# Patient Record
Sex: Female | Born: 1935 | Race: White | Hispanic: No | Smoking: Never smoker
Health system: Southern US, Community
[De-identification: ages and names within clinical notes are randomized; demographics above are authoritative.]

## PROBLEM LIST (undated history)

## (undated) DIAGNOSIS — R3129 Other microscopic hematuria: Secondary | ICD-10-CM

## (undated) DIAGNOSIS — U071 COVID-19: Secondary | ICD-10-CM

## (undated) DIAGNOSIS — A048 Other specified bacterial intestinal infections: Secondary | ICD-10-CM

## (undated) DIAGNOSIS — M810 Age-related osteoporosis without current pathological fracture: Secondary | ICD-10-CM

## (undated) DIAGNOSIS — E782 Mixed hyperlipidemia: Secondary | ICD-10-CM

## (undated) DIAGNOSIS — C439 Malignant melanoma of skin, unspecified: Secondary | ICD-10-CM

## (undated) DIAGNOSIS — I499 Cardiac arrhythmia, unspecified: Secondary | ICD-10-CM

## (undated) DIAGNOSIS — I10 Essential (primary) hypertension: Secondary | ICD-10-CM

## (undated) HISTORY — DX: Other specified bacterial intestinal infections: A04.8

## (undated) HISTORY — DX: Other microscopic hematuria: R31.29

## (undated) HISTORY — DX: Age-related osteoporosis without current pathological fracture: M81.0

## (undated) HISTORY — PX: MOLE REMOVAL: SHX2046

## (undated) HISTORY — PX: TOE SURGERY: SHX1073

## (undated) HISTORY — DX: Cardiac arrhythmia, unspecified: I49.9

## (undated) HISTORY — PX: APPENDECTOMY: SHX54

## (undated) HISTORY — PX: TUBAL LIGATION: SHX77

## (undated) HISTORY — DX: COVID-19: U07.1

## (undated) HISTORY — DX: Essential (primary) hypertension: I10

## (undated) HISTORY — DX: Mixed hyperlipidemia: E78.2

## (undated) HISTORY — DX: Malignant melanoma of skin, unspecified: C43.9

---

## 2000-12-12 ENCOUNTER — Encounter: Payer: Self-pay | Admitting: Family Medicine

## 2000-12-12 ENCOUNTER — Ambulatory Visit (HOSPITAL_COMMUNITY): Admission: RE | Admit: 2000-12-12 | Discharge: 2000-12-12 | Payer: Self-pay | Admitting: Family Medicine

## 2001-02-03 ENCOUNTER — Other Ambulatory Visit: Admission: RE | Admit: 2001-02-03 | Discharge: 2001-02-03 | Payer: Self-pay | Admitting: Family Medicine

## 2002-04-16 ENCOUNTER — Other Ambulatory Visit: Admission: RE | Admit: 2002-04-16 | Discharge: 2002-04-16 | Payer: Self-pay | Admitting: Family Medicine

## 2003-04-28 ENCOUNTER — Other Ambulatory Visit: Admission: RE | Admit: 2003-04-28 | Discharge: 2003-04-28 | Payer: Self-pay | Admitting: Family Medicine

## 2004-07-31 ENCOUNTER — Ambulatory Visit (HOSPITAL_COMMUNITY): Admission: RE | Admit: 2004-07-31 | Discharge: 2004-07-31 | Payer: Self-pay | Admitting: Gastroenterology

## 2006-08-06 ENCOUNTER — Other Ambulatory Visit: Admission: RE | Admit: 2006-08-06 | Discharge: 2006-08-06 | Payer: Self-pay | Admitting: Family Medicine

## 2007-01-31 ENCOUNTER — Encounter: Admission: RE | Admit: 2007-01-31 | Discharge: 2007-01-31 | Payer: Self-pay | Admitting: Gastroenterology

## 2009-08-18 ENCOUNTER — Other Ambulatory Visit: Admission: RE | Admit: 2009-08-18 | Discharge: 2009-08-18 | Payer: Self-pay | Admitting: Family Medicine

## 2011-01-12 NOTE — Op Note (Signed)
NAME:  Kayla Bass, Kayla Bass             ACCOUNT NO.:  [id number]   MEDICAL RECORD NO.:  [id number]          PATIENT TYPE:  AMB   LOCATION:  ENDO                         FACILITY:  [facility]   PHYSICIAN:  Graylin Shiver, M.D.   DATE OF BIRTH:  03/18/1932   DATE OF PROCEDURE:  07/31/2004  DATE OF DISCHARGE:                                 OPERATIVE REPORT   PROCEDURE:  Colonoscopy.   SURGEON:   INDICATIONS FOR PROCEDURE:  Screening.   Informed consent was obtained after explanation of the risks of bleeding,  infection and perforation.   PREMEDICATION:  Fentanyl 75 mcg IV and Versed 7 mg IV.   DESCRIPTION OF PROCEDURE:  With the patient in the left lateral decubitus  position, a rectal examination was performed.  No masses were felt.  The  Olympus colonoscope was inserted into the rectum and advanced around to the  colon to the cecum.  The cecal landmarks were identified.  The cecum and  ascending colon were normal.  The transverse colon was normal.  The  descending colon, sigmoid and rectum were normal.  The patient tolerated the  procedure well without complications.   IMPRESSION:  Normal colonoscopy to the cecum.   RECOMMENDATIONS:  I would recommend a follow-up colonoscopy again in 10  years.       SFG/MEDQ  D:  07/31/2004  T:  07/31/2004  Job:  [id number]   cc:   Dellis Anes. Idell Pickles, M.D.  [address]  [place]  [place] [address]  Fax: [phone]

## 2012-09-09 ENCOUNTER — Other Ambulatory Visit (HOSPITAL_COMMUNITY)
Admission: RE | Admit: 2012-09-09 | Discharge: 2012-09-09 | Disposition: A | Discharge status: Home / Self Care | Payer: Medicare Other | Source: Ambulatory Visit | Attending: Family Medicine | Admitting: Family Medicine

## 2012-09-09 ENCOUNTER — Other Ambulatory Visit: Payer: Self-pay | Admitting: Family Medicine

## 2012-09-09 DIAGNOSIS — Z124 Encounter for screening for malignant neoplasm of cervix: Secondary | ICD-10-CM | POA: Insufficient documentation

## 2013-02-13 ENCOUNTER — Other Ambulatory Visit: Payer: Self-pay

## 2016-10-10 ENCOUNTER — Telehealth: Payer: Self-pay

## 2016-10-10 NOTE — Telephone Encounter (Signed)
SENT NOTES TO SCHEDULING 

## 2016-11-02 ENCOUNTER — Encounter: Payer: Self-pay | Admitting: *Deleted

## 2016-11-09 ENCOUNTER — Encounter: Payer: Self-pay | Admitting: Interventional Cardiology

## 2016-11-09 ENCOUNTER — Ambulatory Visit (INDEPENDENT_AMBULATORY_CARE_PROVIDER_SITE_OTHER): Payer: Medicare Other | Admitting: Interventional Cardiology

## 2016-11-09 ENCOUNTER — Encounter (INDEPENDENT_AMBULATORY_CARE_PROVIDER_SITE_OTHER): Payer: Self-pay

## 2016-11-09 VITALS — BP 158/92 | HR 65 | Ht 65.0 in | Wt 130.8 lb

## 2016-11-09 DIAGNOSIS — E782 Mixed hyperlipidemia: Secondary | ICD-10-CM | POA: Diagnosis not present

## 2016-11-09 DIAGNOSIS — I10 Essential (primary) hypertension: Secondary | ICD-10-CM | POA: Insufficient documentation

## 2016-11-09 DIAGNOSIS — I491 Atrial premature depolarization: Secondary | ICD-10-CM | POA: Diagnosis not present

## 2016-11-09 NOTE — Progress Notes (Signed)
Cardiology Office Note   Date:  11/09/2016   ID:  Kayla Bass, DOB 1932-02-04, MRN [id number]  PCP:  Gerrit Heck, MD    No chief complaint on file. irregular heart beat   Wt Readings from Last 3 Encounters:  11/09/16 130 lb 12.8 oz (59.3 kg)       History of Present Illness: Kayla Bass is a 83 y.o. female  Who has a history of skipped beats in the past.  Discovered in [place] over 30 years ago.  Se was told not to worry about.  I twas benign.  She had no sx.    She was at a routine appt with Dr. Drema Dallas.  An irregular heart beat was heard.  She is sent her for further eval.    THe patient walks/exercises daily.  She does it 20 minutes at a time.  She volunteers at [facility]    No chest pain, SHOB, palpitations, lightheadedness, syncope.  Rare DOE if she over exerts.  Rare vertigo sx.      Past Medical History:  Diagnosis Date  . early melanoma of left arm   . Essential (primary) hypertension   . Irregular heartbeat   . Microscopic hematuria   . Mixed hyperlipidemia   . Osteoporosis     Past Surgical History:  Procedure Laterality Date  . APPENDECTOMY    . MOLE REMOVAL     cancerous  . TOE SURGERY Right   . TUBAL LIGATION Bilateral      Current Outpatient Prescriptions  Medication Sig Dispense Refill  . Biotin 5 MG CAPS Take 5 mg by mouth daily.    Marland Kitchen CALCIUM PO Take by mouth daily. As directed    . Cholecalciferol (VITAMIN D-3 PO) Take 2,800 Units by mouth daily.    . hydrochlorothiazide (HYDRODIURIL) 25 MG tablet Take 25 mg by mouth daily.    Marland Kitchen lisinopril (PRINIVIL,ZESTRIL) 10 MG tablet Take 10 mg by mouth daily.    Marland Kitchen lovastatin (MEVACOR) 40 MG tablet Take 40 mg by mouth at bedtime.    . LUTEIN PO Take 1 capsule by mouth daily.      No current facility-administered medications for this visit.     Allergies:   Benicar [olmesartan]; Fosamax [alendronate]; and Other    Social History:  The patient  reports that she has  never smoked. She has never used smokeless tobacco. She reports that she drinks alcohol.   Family History:  The patient's family history includes Alzheimer's disease in her mother; CAD in her brother, brother, and brother; Healthy in her daughter, sister, sister, sister, and son; Suicidality in her father.    ROS:  Please see the history of present illness.   Otherwise, review of systems are positive for rare joint pain.   All other systems are reviewed and negative.    PHYSICAL EXAM: VS:  BP (!) 158/92 (BP Location: Left Arm)   Pulse 65   Ht 5\' 5"  (1.651 m)   Wt 130 lb 12.8 oz (59.3 kg)   SpO2 96%   BMI 21.77 kg/m  , BMI Body mass index is 21.77 kg/m. GEN: Well nourished, well developed, in no acute distress  HEENT: normal  Neck: no JVD, carotid bruits, or masses Cardiac: RRR; no murmurs, rubs, or gallops,no edema  Respiratory:  clear to auscultation bilaterally, normal work of breathing GI: soft, nontender, nondistended, + BS MS: no deformity or atrophy  Skin: warm and dry, no rash Neuro:  Strength and  sensation are intact Psych: euthymic mood, full affect   EKG:   The ekg ordered today demonstrates NSR, prolonged PR interval, No ST segment changes   Recent Labs: No results found for requested labs within last 8760 hours.   Lipid Panel No results found for: CHOL, TRIG, HDL, CHOLHDL, VLDL, LDLCALC, LDLDIRECT   Other studies Reviewed: Additional studies/ records that were reviewed today with results demonstrating: Prior ECG shows NSR with PACs.   ASSESSMENT AND PLAN:  1. PACs: Noted on ECG from [place]. None today.  She had a benign w/u several years ago at [place].  No pathologic arrythmia noted on ECG.  No symptoms of cardiac disease.  Continue to monitor.  If she develops  symptoms, could consider further testing.     2. HTN: readings increased in the MDs office.  Lower on my recheck 146/80. 3. Hyperlipidemia: COntinue lovastatin.  Managed by Dr. Drema Dallas.   Current  medicines are reviewed at length with the patient today.  The patient concerns regarding her medicines were addressed.  The following changes have been made:  No change  Labs/ tests ordered today include:  No orders of the defined types were placed in this encounter.   Recommend 150 minutes/week of aerobic exercise Low fat, low carb, high fiber diet recommended  Disposition:   FU prn   Signed, Kayla Grooms, MD  11/09/2016 11:47 AM    [facility] [place], [place], [place]  [address] Phone: [phone]; Fax: [phone]

## 2016-11-09 NOTE — Patient Instructions (Signed)
Medication Instructions:  Your physician recommends that you continue on your current medications as directed. Please refer to the Current Medication list given to you today.   Labwork: NONE  Testing/Procedures: NONE  Follow-Up: FOLLOW UP WITH DR. VARANASI AS NEEDED  Any Other Special Instructions Will Be Listed Below (If Applicable).     If you need a refill on your cardiac medications before your next appointment, please call your pharmacy.

## 2017-10-17 ENCOUNTER — Other Ambulatory Visit: Payer: Self-pay | Admitting: Family Medicine

## 2017-11-20 ENCOUNTER — Encounter: Payer: Self-pay | Admitting: Interventional Cardiology

## 2017-11-20 ENCOUNTER — Ambulatory Visit: Payer: Medicare Other | Admitting: Interventional Cardiology

## 2017-11-20 VITALS — BP 130/74 | HR 72 | Ht 65.5 in | Wt 131.0 lb

## 2017-11-20 DIAGNOSIS — I10 Essential (primary) hypertension: Secondary | ICD-10-CM | POA: Diagnosis not present

## 2017-11-20 DIAGNOSIS — I491 Atrial premature depolarization: Secondary | ICD-10-CM | POA: Diagnosis not present

## 2017-11-20 DIAGNOSIS — E782 Mixed hyperlipidemia: Secondary | ICD-10-CM

## 2017-11-20 NOTE — Progress Notes (Signed)
Cardiology Office Note   Date:  11/20/2017   ID:  Kayla Bass, DOB [birth year], MRN [id number]  PCP:  Leighton Ruff, MD    No chief complaint on file.  PACs  Wt Readings from Last 3 Encounters:  11/20/17 131 lb (59.4 kg)  11/09/16 130 lb 12.8 oz (59.3 kg)       History of Present Illness: Kayla Bass is a 84 y.o. female Who has a history of skipped beats in the past.  Discovered in [place] over 30 years ago.  Se was told not to worry about.  I twas benign.  She had no sx.    She was at a routine appt with Dr. Drema Dallas.  An irregular heart beat was heard.  She was sent her for further eval and diagnosed with PACs.    History of hyperlipidemia treated with lovastatin, managed by Dr. Drema Dallas.  History of hypertension as well with increased readings in the doctor's office at times.  Since the last visit, she had one episode of palpitations after working in the yard, but this resolved with rest.  Episode lasted about 1-2 minutes.    Since then, she denies : Chest pain. Dizziness. Leg edema. Nitroglycerin use. Orthopnea. Palpitations. Paroxysmal nocturnal dyspnea. Shortness of breath. Syncope.   She walks on  A treadmill 5-6 days/week.  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      Past Medical History:  Diagnosis Date  . early melanoma of left arm   . Essential (primary) hypertension   . Irregular heartbeat   . Microscopic hematuria   . Mixed hyperlipidemia   . Osteoporosis     Past Surgical History:  Procedure Laterality Date  . APPENDECTOMY    . MOLE REMOVAL     cancerous  . TOE SURGERY Right   . TUBAL LIGATION Bilateral      Current Outpatient Medications  Medication Sig Dispense Refill  . Biotin 5 MG CAPS Take 5 mg by mouth daily.    Marland Kitchen CALCIUM PO Take by mouth daily. As directed    . Cholecalciferol (VITAMIN D-3 PO) Take 2,800 Units by mouth daily.    . hydrochlorothiazide (HYDRODIURIL) 25 MG tablet Take 25 mg by mouth  daily.    Marland Kitchen lisinopril (PRINIVIL,ZESTRIL) 10 MG tablet Take 10 mg by mouth daily.    Marland Kitchen lovastatin (MEVACOR) 40 MG tablet Take 40 mg by mouth at bedtime.    . LUTEIN PO Take 1 capsule by mouth daily.     . Magnesium 200 MG TABS Take 250 mg by mouth 2 (two) times daily.     No current facility-administered medications for this visit.     Allergies:   Benicar [olmesartan]; Fosamax [alendronate]; and Other    Social History:  The patient  reports that she has never smoked. She has never used smokeless tobacco. She reports that she drinks alcohol.   Family History:  The patient's family history includes Alzheimer's disease in her mother; CAD in her brother, brother, and brother; Healthy in her daughter, sister, sister, sister, and son; Suicidality in her father.    ROS:  Please see the history of present illness.   Otherwise, review of systems are positive for intermittent palpitations.   All other systems are reviewed and negative.    PHYSICAL EXAM: VS:  BP 130/74   Pulse 72   Ht 5' 5.5" (1.664 m)   Wt 131 lb (59.4 kg)   SpO2 98%  BMI 21.47 kg/m  , BMI Body mass index is 21.47 kg/m. GEN: Well nourished, well developed, in no acute distress  HEENT: normal  Neck: no JVD, carotid bruits, or masses Cardiac: RRR; no murmurs, rubs, or gallops,no edema  Respiratory:  clear to auscultation bilaterally, normal work of breathing GI: soft, nontender, nondistended, + BS MS: no deformity or atrophy  Skin: warm and dry, no rash Neuro:  Strength and sensation are intact Psych: euthymic mood, full affect   EKG:   The ekg ordered today demonstrates Normal Sinus rhythm, no ST changes   Recent Labs: No results found for requested labs within last 8760 hours.   Lipid Panel No results found for: CHOL, TRIG, HDL, CHOLHDL, VLDL, LDLCALC, LDLDIRECT   Other studies Reviewed: Additional studies/ records that were reviewed today with results demonstrating: labs reviewed LDL 90 in  2019.   ASSESSMENT AND PLAN:  1. PACs: She had some on exam but remained asymptomatic. Not causing her to limit any activities. 2. HTN: The current medical regimen is effective;  continue present plan and medications. 3. Hyperlipidemia: LDL controlled. Continue lovastatin.    Current medicines are reviewed at length with the patient today.  The patient concerns regarding her medicines were addressed.  The following changes have been made:  No change  Labs/ tests ordered today include:  No orders of the defined types were placed in this encounter.   Recommend 150 minutes/week of aerobic exercise Low fat, low carb, high fiber diet recommended  Disposition:   FU as needed   Signed, Larae Grooms, MD  11/20/2017 2:35 PM    [facility] [place], [place], [place]  [address] Phone: [phone]; Fax: [phone]

## 2017-11-20 NOTE — Patient Instructions (Signed)
Medication Instructions:  Your physician recommends that you continue on your current medications as directed. Please refer to the Current Medication list given to you today.   Labwork: None ordered  Testing/Procedures: None ordered  Follow-Up: Your physician wants you to follow-up in: AS NEEDED with Dr. Irish Lack   Any Other Special Instructions Will Be Listed Below (If Applicable).     If you need a refill on your cardiac medications before your next appointment, please call your pharmacy.

## 2019-09-25 ENCOUNTER — Ambulatory Visit: Payer: Medicare Other

## 2019-10-01 ENCOUNTER — Ambulatory Visit: Payer: Medicare Other | Attending: Internal Medicine

## 2020-12-02 DIAGNOSIS — D3132 Benign neoplasm of left choroid: Secondary | ICD-10-CM | POA: Diagnosis not present

## 2020-12-02 DIAGNOSIS — H524 Presbyopia: Secondary | ICD-10-CM | POA: Diagnosis not present

## 2020-12-02 DIAGNOSIS — H40013 Open angle with borderline findings, low risk, bilateral: Secondary | ICD-10-CM | POA: Diagnosis not present

## 2020-12-02 DIAGNOSIS — H02831 Dermatochalasis of right upper eyelid: Secondary | ICD-10-CM | POA: Diagnosis not present

## 2020-12-02 DIAGNOSIS — H35013 Changes in retinal vascular appearance, bilateral: Secondary | ICD-10-CM | POA: Diagnosis not present

## 2020-12-23 DIAGNOSIS — Z0000 Encounter for general adult medical examination without abnormal findings: Secondary | ICD-10-CM | POA: Diagnosis not present

## 2020-12-23 DIAGNOSIS — M810 Age-related osteoporosis without current pathological fracture: Secondary | ICD-10-CM | POA: Diagnosis not present

## 2020-12-23 DIAGNOSIS — I10 Essential (primary) hypertension: Secondary | ICD-10-CM | POA: Diagnosis not present

## 2020-12-23 DIAGNOSIS — E782 Mixed hyperlipidemia: Secondary | ICD-10-CM | POA: Diagnosis not present

## 2021-01-10 DIAGNOSIS — L814 Other melanin hyperpigmentation: Secondary | ICD-10-CM | POA: Diagnosis not present

## 2021-01-10 DIAGNOSIS — D225 Melanocytic nevi of trunk: Secondary | ICD-10-CM | POA: Diagnosis not present

## 2021-01-10 DIAGNOSIS — D1801 Hemangioma of skin and subcutaneous tissue: Secondary | ICD-10-CM | POA: Diagnosis not present

## 2021-01-10 DIAGNOSIS — L570 Actinic keratosis: Secondary | ICD-10-CM | POA: Diagnosis not present

## 2021-01-10 DIAGNOSIS — L821 Other seborrheic keratosis: Secondary | ICD-10-CM | POA: Diagnosis not present

## 2021-01-10 DIAGNOSIS — Z85828 Personal history of other malignant neoplasm of skin: Secondary | ICD-10-CM | POA: Diagnosis not present

## 2021-03-22 DIAGNOSIS — Z1231 Encounter for screening mammogram for malignant neoplasm of breast: Secondary | ICD-10-CM | POA: Diagnosis not present

## 2021-06-06 DIAGNOSIS — R1013 Epigastric pain: Secondary | ICD-10-CM | POA: Diagnosis not present

## 2021-06-06 DIAGNOSIS — R142 Eructation: Secondary | ICD-10-CM | POA: Diagnosis not present

## 2021-06-26 DIAGNOSIS — M810 Age-related osteoporosis without current pathological fracture: Secondary | ICD-10-CM | POA: Diagnosis not present

## 2021-06-26 DIAGNOSIS — E782 Mixed hyperlipidemia: Secondary | ICD-10-CM | POA: Diagnosis not present

## 2021-06-26 DIAGNOSIS — K219 Gastro-esophageal reflux disease without esophagitis: Secondary | ICD-10-CM | POA: Diagnosis not present

## 2021-06-26 DIAGNOSIS — I10 Essential (primary) hypertension: Secondary | ICD-10-CM | POA: Diagnosis not present

## 2021-06-29 ENCOUNTER — Encounter: Payer: Self-pay | Admitting: Nurse Practitioner

## 2021-07-05 DIAGNOSIS — M8589 Other specified disorders of bone density and structure, multiple sites: Secondary | ICD-10-CM | POA: Diagnosis not present

## 2021-07-10 DIAGNOSIS — E871 Hypo-osmolality and hyponatremia: Secondary | ICD-10-CM | POA: Diagnosis not present

## 2021-07-18 ENCOUNTER — Other Ambulatory Visit (INDEPENDENT_AMBULATORY_CARE_PROVIDER_SITE_OTHER): Payer: Medicare Other

## 2021-07-18 ENCOUNTER — Encounter: Payer: Self-pay | Admitting: Nurse Practitioner

## 2021-07-18 ENCOUNTER — Ambulatory Visit (INDEPENDENT_AMBULATORY_CARE_PROVIDER_SITE_OTHER): Payer: Medicare Other | Admitting: Nurse Practitioner

## 2021-07-18 ENCOUNTER — Other Ambulatory Visit: Payer: Self-pay

## 2021-07-18 VITALS — BP 112/74 | HR 73 | Ht 65.0 in | Wt 128.6 lb

## 2021-07-18 DIAGNOSIS — R1013 Epigastric pain: Secondary | ICD-10-CM

## 2021-07-18 DIAGNOSIS — R10814 Left lower quadrant abdominal tenderness: Secondary | ICD-10-CM | POA: Diagnosis not present

## 2021-07-18 DIAGNOSIS — R1033 Periumbilical pain: Secondary | ICD-10-CM

## 2021-07-18 DIAGNOSIS — R634 Abnormal weight loss: Secondary | ICD-10-CM

## 2021-07-18 DIAGNOSIS — R142 Eructation: Secondary | ICD-10-CM

## 2021-07-18 DIAGNOSIS — E782 Mixed hyperlipidemia: Secondary | ICD-10-CM | POA: Insufficient documentation

## 2021-07-18 DIAGNOSIS — K219 Gastro-esophageal reflux disease without esophagitis: Secondary | ICD-10-CM | POA: Insufficient documentation

## 2021-07-18 DIAGNOSIS — J302 Other seasonal allergic rhinitis: Secondary | ICD-10-CM | POA: Insufficient documentation

## 2021-07-18 DIAGNOSIS — M810 Age-related osteoporosis without current pathological fracture: Secondary | ICD-10-CM | POA: Insufficient documentation

## 2021-07-18 DIAGNOSIS — Z85820 Personal history of malignant melanoma of skin: Secondary | ICD-10-CM | POA: Insufficient documentation

## 2021-07-18 LAB — BASIC METABOLIC PANEL
BUN: 13 mg/dL (ref 6–23)
CO2: 31 mEq/L (ref 19–32)
Calcium: 9.6 mg/dL (ref 8.4–10.5)
Chloride: 99 mEq/L (ref 96–112)
Creatinine, Ser: 0.8 mg/dL (ref 0.40–1.20)
GFR: 66.03 mL/min (ref >60.00)
Glucose, Bld: 96 mg/dL (ref 70–99)
Potassium: 4.6 mEq/L (ref 3.5–5.1)
Sodium: 136 mEq/L (ref 135–145)

## 2021-07-18 MED ORDER — FAMOTIDINE 20 MG PO TABS: 20.0000 mg | ORAL_TABLET | Freq: Every day | ORAL | 1 refills | Status: DC

## 2021-07-18 NOTE — Progress Notes (Signed)
07/18/2021 Kayla Bass [id number] 28-Apr-1932   CHIEF COMPLAINT: burping, upper abdominal tightness, weight loss   HISTORY OF PRESENT ILLNESS:  Kayla Bass is an 87 year old female with a past medical history of hypertension, hyperlipidemia, osteoporosis, left arm melanoma s/p excision. Past appendectomy, tubal ligation and right toe surgery. She presents her office today as referred by Dr. Leighton Ruff for further evaluation regarding burping with associated epigastric tightness which started the last weeek of Sept 2022.  She is accompanied by her nonbiological family member [place].  She develops burping with central upper abdominal tightness which occurs at nighttime.  She takes Tums with some relief.  When she awakens in the morning, she feels well without burping and without any abdominal pain. No bloat. She sometimes has throat irritation which she attributes to having postnasal drainage.  She eats a healthy breakfast and her larger meal a day is at lunchtime and she eats a smaller dinner without experience any burping or abdominal pain.  At nighttime, she sits up to read and her burping starts and worsens when she lays down.  Her upper abdominal pain sometimes awakens her from sleep at night.  No nausea or vomiting.  She was prescribed Omeprazole 20 mg daily by her PCP on 06/06/2021 which she took for 30 days without improvement.  She also went on a GERD diet which resulted in decreased oral intake and she lost about 8 pounds.  No weight loss prior to this diet change.  No fever, sweats or chills.  She denies ever having an EGD.  She underwent 3 screening colonoscopies in her lifetime which she reported were all normal, no polyps.  Her last colonoscopy was around age 17.  She stated she and her PCP decided she no longer needed any further screening colonoscopies.  No known family history of esophageal, gastric or colon cancer.  Laboratory studies 06/26/2021: Glucose 94.  BUN 11.   Creatinine 0.80.  Sodium 133.  Potassium 4.9.  Calcium 9.8.  Total bili 0.6.  Alk phos 55.  AST 19.  ALT 12.  WBC 5.8.  Hemoglobin 14.4.  Hematocrit 41.4.  Platelet 177.  Past Medical History:  Diagnosis Date   early melanoma of left arm    Essential (primary) hypertension    Irregular heartbeat    Microscopic hematuria    Mixed hyperlipidemia    Osteoporosis    Past Surgical History:  Procedure Laterality Date   APPENDECTOMY     MOLE REMOVAL     cancerous   TOE SURGERY Right    TUBAL LIGATION Bilateral    Social History: She is widowed.  She has 1 son and 1 daughter.  She is a Agricultural engineer.  Nonsmoker. She drinks 3 glasses of wine every 2 months.   Family History: Mother with history of heart disease and Alzheimer's.  Father died from suicide. Brother with heart disease.  Brother with alcohol related cirrhosis. Sister with heart disease.    Allergies  Allergen Reactions   Benicar [Olmesartan]     Stomach upset     Fosamax [Alendronate]     Stomach upset    Other     Unsure of name - antibiotic for an UTI, caused mental confusion and cloudiness     Outpatient Encounter Medications as of 07/18/2021  Medication Sig   Biotin 5 MG CAPS Take 5 mg by mouth daily.   CALCIUM PO Take by mouth daily. As directed   Cholecalciferol (VITAMIN D-3 PO)  Take 2,800 Units by mouth daily.   hydrochlorothiazide (HYDRODIURIL) 25 MG tablet Take 25 mg by mouth daily.   lisinopril (PRINIVIL,ZESTRIL) 10 MG tablet Take 10 mg by mouth daily.   lovastatin (MEVACOR) 40 MG tablet Take 40 mg by mouth at bedtime.   LUTEIN PO Take 1 capsule by mouth daily.    Magnesium 200 MG TABS Take 250 mg by mouth 2 (two) times daily.   No facility-administered encounter medications on file as of 07/18/2021.    REVIEW OF SYSTEMS:  Gen: See HPI.  CV: Denies chest pain, palpitations or edema. Resp: Denies cough, shortness of breath of hemoptysis.  GI: See HPI.  GU : Denies urinary burning, blood in urine,  increased urinary frequency or incontinence. MS: Denies joint pain, muscles aches or weakness. Derm: Denies rash, itchiness, skin lesions or unhealing ulcers. Psych: Denies depression, anxiety or memory loss. Heme: Denies bruising, bleeding. Neuro:  Denies headaches, dizziness or paresthesias. Endo:  Denies any problems with DM, thyroid or adrenal function.   PHYSICAL EXAM: BP 112/74   Pulse 73   Ht 5' 5" (1.651 m)   Wt 128 lb 9.6 oz (58.3 kg)   SpO2 97%   BMI 21.40 kg/m  Wt Readings from Last 3 Encounters:  07/18/21 128 lb 9.6 oz (58.3 kg)  11/20/17 131 lb (59.4 kg)  11/09/16 130 lb 12.8 oz (59.3 kg)    General: 87 year old female in NAD.  Head: Normocephalic and atraumatic. Eyes:  Sclerae non-icteric, conjunctive pink. Ears: Normal auditory acuity. Mouth: Dentition intact. No ulcers or lesions.  Neck: Supple, no lymphadenopathy or thyromegaly.  Lungs: Clear bilaterally to auscultation without wheezes, crackles or rhonchi. Heart: Regular rate and rhythm. No murmur, rub or gallop appreciated.  Abdomen: Soft, non distended. Mild epigastric tenderness with moderate tenderness 2 cm above the umbilicus, mild LLQ tenderness without rebound or guarding. No masses. No hepatosplenomegaly. Normoactive bowel sounds x 4 quadrants.  Rectal: Deferred.  Musculoskeletal: Symmetrical with no gross deformities. Skin: Warm and dry. No rash or lesions on visible extremities. Extremities: No edema. Neurological: Alert oriented x 4, no focal deficits.  Psychological:  Alert and cooperative. Normal mood and affect.  ASSESSMENT AND PLAN:  65) 87 year old female with burping with associated upper abdominal tightness/pain x 2 months without nausea or vomiting.  Moderate epigastric tenderness which radiated above the umbilicus on exam today. -BMP -CTAP with oral and IV contrast, BUN and creatinine level to be reviewed prior to the patient receiving IV contrast -Consider EGD if CTAP  unrevealing -Famotidine 20 mg nightly.  Gas-X 1 tab p.o. nightly. -FODMAP diet -Patient to call office if symptoms worsen  2) Remote history of melanoma     CC:  Leighton Ruff, MD

## 2021-07-18 NOTE — Progress Notes (Signed)
I agree with the assessment and plan as outlined by Ms. Kennedy-Smith. 

## 2021-07-18 NOTE — Patient Instructions (Addendum)
IMAGING: You will be contacted by [facility] (Your caller ID will indicate phone # [phone]) in the next 7 days to schedule your Abdominal CT scan. If you have not heard from them within 7 business days, please call [place] at [phone] to follow up on the status of your appointment.    LABS:  Lab work has been ordered for you today. Our lab is located in the basement. Press "B" on the elevator. The lab is located at the first door on the left as you exit the elevator.  MEDICATION: We have sent the following medication to your pharmacy for you to pick up at your convenience: Famotidine 20 MG tablet once a day.  OVER THE COUNTER MEDICATION Please purchase the following medications over the counter and take as directed:  Gas-X take 1 at night.  We have scheduled you a follow up with Dr. Lorenso Courier on 08/17/21 at 10:30 am located on the 2nd floor.  It was great seeing you today! Thank you for entrusting me with your care and choosing [facility].  Noralyn Pick, CRNP  Low-FODMAP Eating Plan FODMAP stands for fermentable oligosaccharides, disaccharides, monosaccharides, and polyols. These are sugars that are hard for some people to digest. A low-FODMAP eating plan may help some people who have irritable bowel syndrome (IBS) and certain other bowel (intestinal) diseases to manage their symptoms. This meal plan can be complicated to follow. Work with a diet and nutrition specialist (dietitian) to make a low-FODMAP eating plan that is right for you. A dietitian can help make sure that you get enough nutrition from this diet. What are tips for following this plan? Reading food labels Check labels for hidden FODMAPs such as: High-fructose syrup. Honey. Agave. Natural fruit flavors. Onion or garlic powder. Choose low-FODMAP foods that contain 3-4 grams of fiber per serving. Check food labels for serving sizes. Eat only one  serving at a time to make sure FODMAP levels stay low. Shopping Shop with a list of foods that are recommended on this diet and make a meal plan. Meal planning Follow a low-FODMAP eating plan for up to 6 weeks, or as told by your health care provider or dietitian. To follow the eating plan: Eliminate high-FODMAP foods from your diet completely. Choose only low-FODMAP foods to eat. You will do this for 2-6 weeks. Gradually reintroduce high-FODMAP foods into your diet one at a time. Most people should wait a few days before introducing the next new high-FODMAP food into their meal plan. Your dietitian can recommend how quickly you may reintroduce foods. Keep a daily record of what and how much you eat and drink. Make note of any symptoms that you have after eating. Review your daily record with a dietitian regularly to identify which foods you can eat and which foods you should avoid. General tips Drink enough fluid each day to keep your urine pale yellow. Avoid processed foods. These often have added sugar and may be high in FODMAPs. Avoid most dairy products, whole grains, and sweeteners. Work with a dietitian to make sure you get enough fiber in your diet. Avoid high FODMAP foods at meals to manage symptoms. Recommended foods Fruits Bananas, oranges, tangerines, lemons, limes, blueberries, raspberries, strawberries, grapes, cantaloupe, honeydew melon, kiwi, papaya, passion fruit, and pineapple. Limited amounts of dried cranberries, banana chips, and shredded coconut. Vegetables Eggplant, zucchini, cucumber, peppers, green beans, bean sprouts, lettuce, arugula, kale, Swiss chard, spinach, collard greens, Cheral Marker, summer  squash, potato, and tomato. Limited amounts of corn, carrot, and sweet potato. Green parts of scallions. Grains Gluten-free grains, such as rice, oats, buckwheat, quinoa, corn, polenta, and millet. Gluten-free pasta, bread, or cereal. Rice noodles. Corn tortillas. Meats and  other proteins Unseasoned beef, pork, poultry, or fish. Eggs. Berniece Salines. Tofu (firm) and tempeh. Limited amounts of nuts and seeds, such as almonds, walnuts, [place] nuts, pecans, peanuts, nut butters, pumpkin seeds, chia seeds, and sunflower seeds. Dairy Lactose-free milk, yogurt, and kefir. Lactose-free cottage cheese and ice cream. Non-dairy milks, such as almond, coconut, hemp, and rice milk. Non-dairy yogurt. Limited amounts of goat cheese, brie, mozzarella, parmesan, swiss, and other hard cheeses. Fats and oils Butter-free spreads. Vegetable oils, such as olive, canola, and sunflower oil. Seasoning and other foods Artificial sweeteners with names that do not end in "ol," such as aspartame, saccharine, and stevia. Maple syrup, white table sugar, raw sugar, brown sugar, and molasses. Mayonnaise, soy sauce, and tamari. Fresh basil, coriander, parsley, rosemary, and thyme. Beverages Water and mineral water. Sugar-sweetened soft drinks. Small amounts of orange juice or cranberry juice. Black and green tea. Most dry wines. Coffee. The items listed above may not be a complete list of foods and beverages you can eat. Contact a dietitian for more information. Foods to avoid Fruits Fresh, dried, and juiced forms of apple, pear, watermelon, peach, plum, cherries, apricots, blackberries, boysenberries, figs, nectarines, and mango. Avocado. Vegetables Chicory root, artichoke, asparagus, cabbage, snow peas, Brussels sprouts, broccoli, sugar snap peas, mushrooms, celery, and cauliflower. Onions, garlic, leeks, and the white part of scallions. Grains Wheat, including kamut, durum, and semolina. Barley and bulgur. Couscous. Wheat-based cereals. Wheat noodles, bread, crackers, and pastries. Meats and other proteins Fried or fatty meat. Sausage. Cashews and pistachios. Soybeans, baked beans, black beans, chickpeas, kidney beans, fava beans, navy beans, lentils, black-eyed peas, and split peas. Dairy Milk, yogurt,  ice cream, and soft cheese. Cream and sour cream. Milk-based sauces. Custard. Buttermilk. Soy milk. Seasoning and other foods Any sugar-free gum or candy. Foods that contain artificial sweeteners such as sorbitol, mannitol, isomalt, or xylitol. Foods that contain honey, high-fructose corn syrup, or agave. Bouillon, vegetable stock, beef stock, and chicken stock. Garlic and onion powder. Condiments made with onion, such as hummus, chutney, pickles, relish, salad dressing, and salsa. Tomato paste. Beverages Chicory-based drinks. Coffee substitutes. Chamomile tea. Fennel tea. Sweet or fortified wines such as port or sherry. Diet soft drinks made with isomalt, mannitol, maltitol, sorbitol, or xylitol. Apple, pear, and mango juice. Juices with high-fructose corn syrup. The items listed above may not be a complete list of foods and beverages you should avoid. Contact a dietitian for more information. Summary FODMAP stands for fermentable oligosaccharides, disaccharides, monosaccharides, and polyols. These are sugars that are hard for some people to digest. A low-FODMAP eating plan is a short-term diet that helps to ease symptoms of certain bowel diseases. The eating plan usually lasts up to 6 weeks. After that, high-FODMAP foods are reintroduced gradually and one at a time. This can help you find out which foods may be causing symptoms. A low-FODMAP eating plan can be complicated. It is best to work with a dietitian who has experience with this type of plan. This information is not intended to replace advice given to you by your health care provider. Make sure you discuss any questions you have with your health care provider. Document Revised: 12/31/2019 Document Reviewed: 12/31/2019 Elsevier Patient Education  [place].

## 2021-07-18 NOTE — Progress Notes (Signed)
RADIOLOGY SCHEDULING REQUEST FOR CT Abd pelvis with contrast [facility] Scheduling via secure staff message.

## 2021-07-27 NOTE — Progress Notes (Signed)
Pt.notified

## 2021-08-02 ENCOUNTER — Other Ambulatory Visit: Payer: Self-pay

## 2021-08-02 ENCOUNTER — Encounter (HOSPITAL_COMMUNITY): Payer: Self-pay

## 2021-08-02 ENCOUNTER — Ambulatory Visit (HOSPITAL_COMMUNITY)
Admission: RE | Admit: 2021-08-02 | Discharge: 2021-08-02 | Disposition: A | Discharge status: Home / Self Care | Payer: Medicare Other | Source: Ambulatory Visit | Attending: Nurse Practitioner | Admitting: Nurse Practitioner

## 2021-08-02 DIAGNOSIS — R1013 Epigastric pain: Secondary | ICD-10-CM | POA: Insufficient documentation

## 2021-08-02 DIAGNOSIS — R1033 Periumbilical pain: Secondary | ICD-10-CM | POA: Insufficient documentation

## 2021-08-02 DIAGNOSIS — R634 Abnormal weight loss: Secondary | ICD-10-CM | POA: Diagnosis not present

## 2021-08-02 DIAGNOSIS — R10814 Left lower quadrant abdominal tenderness: Secondary | ICD-10-CM | POA: Diagnosis not present

## 2021-08-02 DIAGNOSIS — R1032 Left lower quadrant pain: Secondary | ICD-10-CM | POA: Diagnosis not present

## 2021-08-02 DIAGNOSIS — R140 Abdominal distension (gaseous): Secondary | ICD-10-CM | POA: Diagnosis not present

## 2021-08-02 MED ORDER — IOHEXOL 350 MG/ML SOLN
75.0000 mL | Freq: Once | INTRAVENOUS | Status: AC | PRN
  Administered 2021-08-02: 75 mL via INTRAVENOUS

## 2021-08-02 MED ORDER — SODIUM CHLORIDE (PF) 0.9 % IJ SOLN
INTRAMUSCULAR | Status: AC
  Filled 2021-08-02: qty 50

## 2021-08-03 ENCOUNTER — Other Ambulatory Visit: Payer: Medicare Other

## 2021-08-03 ENCOUNTER — Other Ambulatory Visit: Payer: Self-pay

## 2021-08-03 DIAGNOSIS — R634 Abnormal weight loss: Secondary | ICD-10-CM

## 2021-08-03 DIAGNOSIS — R1013 Epigastric pain: Secondary | ICD-10-CM

## 2021-08-03 DIAGNOSIS — K219 Gastro-esophageal reflux disease without esophagitis: Secondary | ICD-10-CM

## 2021-08-03 MED ORDER — PANTOPRAZOLE SODIUM 40 MG PO TBEC: 40.0000 mg | DELAYED_RELEASE_TABLET | Freq: Two times a day (BID) | ORAL | 2 refills | Status: DC

## 2021-08-08 ENCOUNTER — Other Ambulatory Visit: Payer: Medicare Other

## 2021-08-08 DIAGNOSIS — R634 Abnormal weight loss: Secondary | ICD-10-CM | POA: Diagnosis not present

## 2021-08-08 DIAGNOSIS — R1013 Epigastric pain: Secondary | ICD-10-CM

## 2021-08-09 ENCOUNTER — Other Ambulatory Visit: Payer: Self-pay

## 2021-08-09 DIAGNOSIS — B9681 Helicobacter pylori [H. pylori] as the cause of diseases classified elsewhere: Secondary | ICD-10-CM

## 2021-08-09 LAB — HELICOBACTER PYLORI  SPECIAL ANTIGEN
MICRO NUMBER:: 12750709
RESULT:: DETECTED — AB
SPECIMEN QUALITY: ADEQUATE

## 2021-08-09 MED ORDER — TETRACYCLINE HCL 500 MG PO CAPS: 500.0000 mg | ORAL_CAPSULE | Freq: Four times a day (QID) | ORAL | 0 refills | Status: DC

## 2021-08-09 MED ORDER — BISMUTH SUBSALICYLATE 262 MG PO CHEW: 262.0000 mg | CHEWABLE_TABLET | Freq: Four times a day (QID) | ORAL | 0 refills | Status: DC

## 2021-08-09 MED ORDER — BISMUTH SUBSALICYLATE 262 MG PO CHEW
524.0000 mg | CHEWABLE_TABLET | Freq: Four times a day (QID) | ORAL | 0 refills | Status: DC
Start: 2021-08-09 — End: 2021-08-16

## 2021-08-09 MED ORDER — OMEPRAZOLE 20 MG PO CPDR: 20.0000 mg | DELAYED_RELEASE_CAPSULE | Freq: Two times a day (BID) | ORAL | 0 refills | Status: DC

## 2021-08-09 MED ORDER — METRONIDAZOLE 250 MG PO TABS: 250.0000 mg | ORAL_TABLET | Freq: Four times a day (QID) | ORAL | 0 refills | Status: DC

## 2021-08-10 ENCOUNTER — Telehealth: Payer: Self-pay | Admitting: Interventional Cardiology

## 2021-08-10 NOTE — Telephone Encounter (Signed)
I spoke with patient and told her this was on her current med list but I asked her to follow up with GI for any questions.  She thought she called GI office.  Number for [facility] given to patient.

## 2021-08-10 NOTE — Telephone Encounter (Signed)
Patient wants to know if she should be taking pantoprazole (PROTONIX) 40 MG tablet.

## 2021-08-11 ENCOUNTER — Telehealth: Payer: Self-pay | Admitting: Nurse Practitioner

## 2021-08-11 NOTE — Telephone Encounter (Signed)
Returned pt call. Reminded her about our conversation as documented on 08/09/21 (refer to path report). Advised she will need to take Tetracycline, Metronidazole, Pepto Bismol and Pantoprazole x14 days. Reminded she has a follow up appointment with Dr. Lorenso Courier on 08/17/21. Further instructions will be given at her appointment re: this treatment and and about her f/u stool study to evaluate the treatment that has been prescribed. Reminded that she will NOT need to hold her Pantoprazole until she completes the 14 day course of treatment, after which she will hold for 14 days BEFORE submitting her stool specimen. Again, reminded that all of this will be detailed at the time of her visit. Verbalized acceptance and understanding.

## 2021-08-11 NOTE — Telephone Encounter (Signed)
Patient called would like to discuss her medications does not know if she is to continue taking the Pantoprazole.

## 2021-08-13 ENCOUNTER — Inpatient Hospital Stay (HOSPITAL_COMMUNITY)
Admission: EM | Admit: 2021-08-13 | Discharge: 2021-08-16 | DRG: 640 | Disposition: A | Discharge status: Home / Self Care | Payer: Medicare Other | Attending: Internal Medicine | Admitting: Internal Medicine

## 2021-08-13 ENCOUNTER — Emergency Department (HOSPITAL_COMMUNITY)
Admission: EM | Admit: 2021-08-13 | Discharge: 2021-08-13 | Discharge status: Against Medical Advice | Payer: Medicare Other | Source: Home / Self Care

## 2021-08-13 ENCOUNTER — Inpatient Hospital Stay (HOSPITAL_COMMUNITY): Payer: Medicare Other

## 2021-08-13 ENCOUNTER — Encounter (HOSPITAL_COMMUNITY): Payer: Self-pay

## 2021-08-13 ENCOUNTER — Other Ambulatory Visit (HOSPITAL_COMMUNITY): Payer: Medicare Other

## 2021-08-13 DIAGNOSIS — E871 Hypo-osmolality and hyponatremia: Secondary | ICD-10-CM | POA: Diagnosis present

## 2021-08-13 DIAGNOSIS — Z888 Allergy status to other drugs, medicaments and biological substances status: Secondary | ICD-10-CM

## 2021-08-13 DIAGNOSIS — R0602 Shortness of breath: Secondary | ICD-10-CM | POA: Diagnosis not present

## 2021-08-13 DIAGNOSIS — Z66 Do not resuscitate: Secondary | ICD-10-CM | POA: Diagnosis not present

## 2021-08-13 DIAGNOSIS — R109 Unspecified abdominal pain: Secondary | ICD-10-CM | POA: Insufficient documentation

## 2021-08-13 DIAGNOSIS — E782 Mixed hyperlipidemia: Secondary | ICD-10-CM | POA: Diagnosis present

## 2021-08-13 DIAGNOSIS — K219 Gastro-esophageal reflux disease without esophagitis: Secondary | ICD-10-CM | POA: Diagnosis not present

## 2021-08-13 DIAGNOSIS — B9681 Helicobacter pylori [H. pylori] as the cause of diseases classified elsewhere: Secondary | ICD-10-CM | POA: Diagnosis present

## 2021-08-13 DIAGNOSIS — I10 Essential (primary) hypertension: Secondary | ICD-10-CM | POA: Diagnosis present

## 2021-08-13 DIAGNOSIS — E860 Dehydration: Secondary | ICD-10-CM | POA: Diagnosis not present

## 2021-08-13 DIAGNOSIS — R197 Diarrhea, unspecified: Secondary | ICD-10-CM | POA: Diagnosis present

## 2021-08-13 DIAGNOSIS — Z85820 Personal history of malignant melanoma of skin: Secondary | ICD-10-CM

## 2021-08-13 DIAGNOSIS — I4891 Unspecified atrial fibrillation: Secondary | ICD-10-CM | POA: Diagnosis present

## 2021-08-13 DIAGNOSIS — Z8249 Family history of ischemic heart disease and other diseases of the circulatory system: Secondary | ICD-10-CM

## 2021-08-13 DIAGNOSIS — I480 Paroxysmal atrial fibrillation: Secondary | ICD-10-CM | POA: Diagnosis present

## 2021-08-13 DIAGNOSIS — U071 COVID-19: Secondary | ICD-10-CM | POA: Diagnosis present

## 2021-08-13 DIAGNOSIS — A048 Other specified bacterial intestinal infections: Secondary | ICD-10-CM | POA: Diagnosis present

## 2021-08-13 DIAGNOSIS — Z79899 Other long term (current) drug therapy: Secondary | ICD-10-CM

## 2021-08-13 DIAGNOSIS — R1110 Vomiting, unspecified: Secondary | ICD-10-CM | POA: Insufficient documentation

## 2021-08-13 DIAGNOSIS — Z820 Family history of epilepsy and other diseases of the nervous system: Secondary | ICD-10-CM | POA: Diagnosis not present

## 2021-08-13 DIAGNOSIS — E876 Hypokalemia: Secondary | ICD-10-CM | POA: Diagnosis not present

## 2021-08-13 DIAGNOSIS — I517 Cardiomegaly: Secondary | ICD-10-CM | POA: Diagnosis not present

## 2021-08-13 DIAGNOSIS — Z5321 Procedure and treatment not carried out due to patient leaving prior to being seen by health care provider: Secondary | ICD-10-CM | POA: Insufficient documentation

## 2021-08-13 LAB — URINALYSIS, ROUTINE W REFLEX MICROSCOPIC
Bilirubin Urine: NEGATIVE
Glucose, UA: NEGATIVE mg/dL
Ketones, ur: >80 mg/dL — AB
Leukocytes,Ua: NEGATIVE
Nitrite: NEGATIVE
Protein, ur: 30 mg/dL — AB
Specific Gravity, Urine: 1.02 (ref 1.005–1.030)
pH: 6.5 (ref 5.0–8.0)

## 2021-08-13 LAB — CBC
HCT: 42.8 % (ref 36.0–46.0)
HCT: 43.5 % (ref 36.0–46.0)
Hemoglobin: 14.9 g/dL (ref 12.0–15.0)
Hemoglobin: 15.4 g/dL — ABNORMAL HIGH (ref 12.0–15.0)
MCH: 30.4 pg (ref 26.0–34.0)
MCH: 30.9 pg (ref 26.0–34.0)
MCHC: 34.8 g/dL (ref 30.0–36.0)
MCHC: 35.4 g/dL (ref 30.0–36.0)
MCV: 87.2 fL (ref 80.0–100.0)
MCV: 87.3 fL (ref 80.0–100.0)
Platelets: 167 10*3/uL (ref 150–400)
Platelets: 171 10*3/uL (ref 150–400)
RBC: 4.9 MIL/uL (ref 3.87–5.11)
RBC: 4.99 MIL/uL (ref 3.87–5.11)
RDW: 11.9 % (ref 11.5–15.5)
RDW: 12 % (ref 11.5–15.5)
WBC: 3.3 10*3/uL — ABNORMAL LOW (ref 4.0–10.5)
WBC: 3.6 10*3/uL — ABNORMAL LOW (ref 4.0–10.5)
nRBC: 0 % (ref 0.0–0.2)
nRBC: 0 % (ref 0.0–0.2)

## 2021-08-13 LAB — COMPREHENSIVE METABOLIC PANEL
ALT: 19 U/L (ref 0–44)
ALT: 19 U/L (ref 0–44)
AST: 35 U/L (ref 15–41)
AST: 36 U/L (ref 15–41)
Albumin: 4 g/dL (ref 3.5–5.0)
Albumin: 4.3 g/dL (ref 3.5–5.0)
Alkaline Phosphatase: 51 U/L (ref 38–126)
Alkaline Phosphatase: 55 U/L (ref 38–126)
Anion gap: 11 (ref 5–15)
Anion gap: 12 (ref 5–15)
BUN: 6 mg/dL — ABNORMAL LOW (ref 8–23)
BUN: 7 mg/dL — ABNORMAL LOW (ref 8–23)
CO2: 26 mmol/L (ref 22–32)
CO2: 27 mmol/L (ref 22–32)
Calcium: 8.8 mg/dL — ABNORMAL LOW (ref 8.9–10.3)
Calcium: 9 mg/dL (ref 8.9–10.3)
Chloride: 82 mmol/L — ABNORMAL LOW (ref 98–111)
Chloride: 83 mmol/L — ABNORMAL LOW (ref 98–111)
Creatinine, Ser: 0.78 mg/dL (ref 0.44–1.00)
Creatinine, Ser: 0.79 mg/dL (ref 0.44–1.00)
GFR, Estimated: >60 mL/min (ref >60)
GFR, Estimated: >60 mL/min (ref >60)
Glucose, Bld: 122 mg/dL — ABNORMAL HIGH (ref 70–99)
Glucose, Bld: 135 mg/dL — ABNORMAL HIGH (ref 70–99)
Potassium: 2.6 mmol/L — CL (ref 3.5–5.1)
Potassium: 2.8 mmol/L — ABNORMAL LOW (ref 3.5–5.1)
Sodium: 120 mmol/L — ABNORMAL LOW (ref 135–145)
Sodium: 121 mmol/L — ABNORMAL LOW (ref 135–145)
Total Bilirubin: 0.8 mg/dL (ref 0.3–1.2)
Total Bilirubin: 1 mg/dL (ref 0.3–1.2)
Total Protein: 7 g/dL (ref 6.5–8.1)
Total Protein: 7.3 g/dL (ref 6.5–8.1)

## 2021-08-13 LAB — PROCALCITONIN: Procalcitonin: <0.1 ng/mL

## 2021-08-13 LAB — ECHOCARDIOGRAM LIMITED
Area-P 1/2: 4.31 cm2
Height: 65 in
P 1/2 time: 708 msec
S' Lateral: 2.4 cm
Weight: 2000 oz

## 2021-08-13 LAB — FIBRINOGEN: Fibrinogen: 319 mg/dL (ref 210–475)

## 2021-08-13 LAB — RESP PANEL BY RT-PCR (FLU A&B, COVID) ARPGX2
Influenza A by PCR: NEGATIVE
Influenza B by PCR: NEGATIVE
SARS Coronavirus 2 by RT PCR: POSITIVE — AB

## 2021-08-13 LAB — LIPASE, BLOOD
Lipase: 46 U/L (ref 11–51)
Lipase: 49 U/L (ref 11–51)

## 2021-08-13 LAB — URINALYSIS, MICROSCOPIC (REFLEX)
Bacteria, UA: NONE SEEN
Squamous Epithelial / HPF: NONE SEEN (ref 0–5)

## 2021-08-13 LAB — TSH: TSH: 4.978 u[IU]/mL — ABNORMAL HIGH (ref 0.350–4.500)

## 2021-08-13 LAB — C-REACTIVE PROTEIN: CRP: <0.5 mg/dL (ref <1.0)

## 2021-08-13 LAB — MAGNESIUM: Magnesium: 1.5 mg/dL — ABNORMAL LOW (ref 1.7–2.4)

## 2021-08-13 LAB — FERRITIN: Ferritin: 137 ng/mL (ref 11–307)

## 2021-08-13 LAB — LACTATE DEHYDROGENASE: LDH: 142 U/L (ref 98–192)

## 2021-08-13 LAB — D-DIMER, QUANTITATIVE: D-Dimer, Quant: 0.61 ug/mL-FEU — ABNORMAL HIGH (ref 0.00–0.50)

## 2021-08-13 MED ORDER — SODIUM CHLORIDE 0.9 % IV SOLN
100.0000 mg | Freq: Every day | INTRAVENOUS | Status: DC
  Administered 2021-08-14 – 2021-08-16 (×3): 100 mg via INTRAVENOUS
  Filled 2021-08-13 (×2): qty 100
  Filled 2021-08-13: qty 20

## 2021-08-13 MED ORDER — PANTOPRAZOLE SODIUM 40 MG IV SOLR
40.0000 mg | INTRAVENOUS | Status: DC
  Administered 2021-08-13 – 2021-08-14 (×2): 40 mg via INTRAVENOUS
  Filled 2021-08-13 (×2): qty 40

## 2021-08-13 MED ORDER — SODIUM CHLORIDE 0.9 % IV SOLN
200.0000 mg | Freq: Once | INTRAVENOUS | Status: AC
  Administered 2021-08-13: 11:00:00 200 mg via INTRAVENOUS
  Filled 2021-08-13: qty 40

## 2021-08-13 MED ORDER — HYDRALAZINE HCL 20 MG/ML IJ SOLN: 5.0000 mg | INTRAMUSCULAR | Status: DC | PRN

## 2021-08-13 MED ORDER — ONDANSETRON HCL 4 MG/2ML IJ SOLN
4.0000 mg | Freq: Four times a day (QID) | INTRAMUSCULAR | Status: DC | PRN
  Administered 2021-08-13: 13:00:00 4 mg via INTRAVENOUS
  Filled 2021-08-13: qty 2

## 2021-08-13 MED ORDER — PREDNISONE 20 MG PO TABS: 50.0000 mg | ORAL_TABLET | Freq: Every day | ORAL | Status: DC

## 2021-08-13 MED ORDER — MAGNESIUM OXIDE -MG SUPPLEMENT 400 (240 MG) MG PO TABS
400.0000 mg | ORAL_TABLET | Freq: Every day | ORAL | Status: DC
  Administered 2021-08-13 – 2021-08-14 (×2): 400 mg via ORAL
  Filled 2021-08-13 (×2): qty 1

## 2021-08-13 MED ORDER — MAGNESIUM 400 MG PO CAPS: 400.0000 mg | ORAL_CAPSULE | Freq: Every day | ORAL | Status: DC

## 2021-08-13 MED ORDER — PRAVASTATIN SODIUM 40 MG PO TABS
40.0000 mg | ORAL_TABLET | Freq: Every day | ORAL | Status: DC
  Administered 2021-08-13 – 2021-08-15 (×3): 40 mg via ORAL
  Filled 2021-08-13 (×3): qty 1

## 2021-08-13 MED ORDER — ACETAMINOPHEN 325 MG PO TABS: 650.0000 mg | ORAL_TABLET | Freq: Four times a day (QID) | ORAL | Status: DC | PRN

## 2021-08-13 MED ORDER — SODIUM CHLORIDE 0.9% FLUSH
3.0000 mL | Freq: Two times a day (BID) | INTRAVENOUS | Status: DC
  Administered 2021-08-13 – 2021-08-16 (×6): 3 mL via INTRAVENOUS

## 2021-08-13 MED ORDER — ONDANSETRON 4 MG PO TBDP
4.0000 mg | ORAL_TABLET | Freq: Once | ORAL | Status: AC | PRN
  Administered 2021-08-13: 04:00:00 4 mg via ORAL
  Filled 2021-08-13: qty 1

## 2021-08-13 MED ORDER — POTASSIUM CHLORIDE CRYS ER 20 MEQ PO TBCR
40.0000 meq | EXTENDED_RELEASE_TABLET | Freq: Once | ORAL | Status: AC
  Administered 2021-08-13: 07:00:00 40 meq via ORAL
  Filled 2021-08-13: qty 2

## 2021-08-13 MED ORDER — MAGNESIUM SULFATE 2 GM/50ML IV SOLN
2.0000 g | Freq: Once | INTRAVENOUS | Status: AC
  Administered 2021-08-13: 08:00:00 2 g via INTRAVENOUS
  Filled 2021-08-13: qty 50

## 2021-08-13 MED ORDER — POTASSIUM CHLORIDE IN NACL 20-0.9 MEQ/L-% IV SOLN
INTRAVENOUS | Status: DC
  Filled 2021-08-13 (×3): qty 1000

## 2021-08-13 MED ORDER — METHYLPREDNISOLONE SODIUM SUCC 125 MG IJ SOLR
1.0000 mg/kg | Freq: Two times a day (BID) | INTRAMUSCULAR | Status: DC
  Administered 2021-08-13 (×2): 56.875 mg via INTRAVENOUS
  Filled 2021-08-13 (×2): qty 2

## 2021-08-13 MED ORDER — OCUVITE-LUTEIN PO CAPS
1.0000 | ORAL_CAPSULE | Freq: Every day | ORAL | Status: DC
  Filled 2021-08-13: qty 1

## 2021-08-13 MED ORDER — ONDANSETRON HCL 4 MG PO TABS: 4.0000 mg | ORAL_TABLET | Freq: Four times a day (QID) | ORAL | Status: DC | PRN

## 2021-08-13 MED ORDER — LACTATED RINGERS IV BOLUS
1000.0000 mL | Freq: Once | INTRAVENOUS | Status: AC
  Administered 2021-08-13: 07:00:00 1000 mL via INTRAVENOUS

## 2021-08-13 MED ORDER — APIXABAN 2.5 MG PO TABS
2.5000 mg | ORAL_TABLET | Freq: Two times a day (BID) | ORAL | Status: DC
  Administered 2021-08-13 – 2021-08-16 (×7): 2.5 mg via ORAL
  Filled 2021-08-13 (×7): qty 1

## 2021-08-13 MED ORDER — ASCORBIC ACID 500 MG PO TABS
500.0000 mg | ORAL_TABLET | Freq: Every day | ORAL | Status: DC
  Administered 2021-08-13 – 2021-08-16 (×4): 500 mg via ORAL
  Filled 2021-08-13 (×4): qty 1

## 2021-08-13 MED ORDER — ZINC SULFATE 220 (50 ZN) MG PO CAPS
220.0000 mg | ORAL_CAPSULE | Freq: Every day | ORAL | Status: DC
  Administered 2021-08-13 – 2021-08-16 (×4): 220 mg via ORAL
  Filled 2021-08-13 (×4): qty 1

## 2021-08-13 MED ORDER — PROSIGHT PO TABS
1.0000 | ORAL_TABLET | Freq: Every day | ORAL | Status: DC
Start: 2021-08-13 — End: 2021-08-16
  Administered 2021-08-14 – 2021-08-16 (×3): 1 via ORAL
  Filled 2021-08-13 (×4): qty 1

## 2021-08-13 MED ORDER — ALBUTEROL SULFATE HFA 108 (90 BASE) MCG/ACT IN AERS
2.0000 | INHALATION_SPRAY | Freq: Four times a day (QID) | RESPIRATORY_TRACT | Status: DC | PRN
  Filled 2021-08-13: qty 6.7

## 2021-08-13 MED ORDER — ACETAMINOPHEN 650 MG RE SUPP: 650.0000 mg | Freq: Four times a day (QID) | RECTAL | Status: DC | PRN

## 2021-08-13 MED ORDER — PRESERVISION AREDS 2+MULTI VIT PO CAPS: 1.0000 | ORAL_CAPSULE | Freq: Every day | ORAL | Status: DC

## 2021-08-13 NOTE — ED Triage Notes (Signed)
Pt states that she has been having abd pain for the past 2 days with n/v/d, seen GI several days ago for the same and placed on antibiotics

## 2021-08-13 NOTE — H&P (Signed)
History and Physical    JAIDYN USERY NUU:[id number] DOB: 09-11-1931 DOA: 08/13/2021  PCP: Kathyrn Lass, MD Consultants:  [facility] - cardiology; Lorenso Courier - GI Patient coming from:  Home - lives with son; Donald Prose: Pandora Leiter, [phone]  Chief Complaint: n/v/d  HPI: MELENA HAYES is a 87 y.o. female with medical history significant of HTN; HLD; ?afib; and recent diagnosis of H pylori presenting with abdominal pain/n/v/d.  She was having some problems and her PCP thought it was reflux.  It didn't better so she sent her to GI.  She started medication Thursday and she has had n/v/d since then.  She did not have the n/v/d prior to starting the medication.  Last stool was this AM but minimal stools.  She has not been able to tolerate PO.  She has vomited everything PO for the last 48 hours.  No fever.  No abdominal pain, just fullness.    ED Course: Seeing GI for belching, malaise - treated for H pylori 2 days ago.  Profuse n/v/d since starting it.  Na++ 121, K+ 2.8.  EKG with afib, ?new.  Will give Mag++.  Review of Systems: As per HPI; otherwise review of systems reviewed and negative.   Ambulatory Status:  Ambulates without assistance  COVID Vaccine Status:  Complete plus booster  Past Medical History:  Diagnosis Date   early melanoma of left arm    Essential (primary) hypertension    Irregular heartbeat    Microscopic hematuria    Mixed hyperlipidemia    Osteoporosis     Past Surgical History:  Procedure Laterality Date   APPENDECTOMY     MOLE REMOVAL     cancerous   TOE SURGERY Right    TUBAL LIGATION Bilateral     Social History   Socioeconomic History   Marital status: Widowed    Spouse name: Not on file   Number of children: Not on file   Years of education: Not on file   Highest education level: Not on file  Occupational History   Occupation: retired  Tobacco Use   Smoking status: Never   Smokeless tobacco: Never  Vaping Use   Vaping Use: Never used  Substance  and Sexual Activity   Alcohol use: Yes    Comment: occassionally   Drug use: Never   Sexual activity: Not on file  Other Topics Concern   Not on file  Social History Narrative   Not on file   Social Determinants of Health   Financial Resource Strain: Not on file  Food Insecurity: Not on file  Transportation Needs: Not on file  Physical Activity: Not on file  Stress: Not on file  Social Connections: Not on file  Intimate Partner Violence: Not on file    Allergies  Allergen Reactions   Alendronate     Stomach upset  Other reaction(s): stomach upset   Benicar [Olmesartan]     Stomach upset     Other     Unsure of name - antibiotic for an UTI, caused mental confusion and cloudiness    Family History  Problem Relation Age of Onset   Alzheimer's disease Mother    Suicidality Father    Healthy Sister    CAD Brother    CAD Brother    CAD Brother    Healthy Sister    Healthy Sister    Healthy Daughter    Healthy Son     Prior to Admission medications   Medication Sig Start Date End  Date Taking? Authorizing Provider  Biotin 5 MG CAPS Take 5 mg by mouth daily.    [provider]  bismuth subsalicylate (PEPTO-BISMOL) 262 MG chewable tablet Chew 2 tablets (524 mg total) by mouth 4 (four) times daily for 14 days. 08/09/21 08/23/21  Sharyn Creamer, MD  CALCIUM PO Take by mouth daily. As directed    [provider]  Cholecalciferol (VITAMIN D-3 PO) Take 2,800 Units by mouth daily.    [provider]  famotidine (PEPCID) 20 MG tablet Take 1 tablet (20 mg total) by mouth at bedtime. 07/18/21   Noralyn Pick, NP  hydrochlorothiazide (HYDRODIURIL) 25 MG tablet Take 25 mg by mouth daily. Takes 1/2 daily    [provider]  lisinopril (PRINIVIL,ZESTRIL) 10 MG tablet Take 10 mg by mouth daily.    [provider]  lovastatin (MEVACOR) 40 MG tablet Take 40 mg by mouth at bedtime.    [provider]  LUTEIN PO Take 1  capsule by mouth daily.     [provider]  Magnesium 200 MG TABS Take 250 mg by mouth 2 (two) times daily.    [provider]  metroNIDAZOLE (FLAGYL) 250 MG tablet Take 1 tablet (250 mg total) by mouth 4 (four) times daily for 14 days. 08/09/21 08/23/21  Sharyn Creamer, MD  pantoprazole (PROTONIX) 40 MG tablet Take 1 tablet (40 mg total) by mouth 2 (two) times daily before a meal. Take 30 mins. before meals 08/03/21   Sharyn Creamer, MD  tetracycline (SUMYCIN) 500 MG capsule Take 1 capsule (500 mg total) by mouth 4 (four) times daily for 14 days. 08/09/21 08/23/21  Sharyn Creamer, MD    Physical Exam: Vitals:   08/13/21 0715 08/13/21 0815 08/13/21 0845 08/13/21 0900  BP: (!) 165/86 127/65 121/80 121/70  Pulse: (!) 44 69 90 89  Resp: 14 17 14 10   Temp:      TempSrc:      SpO2: 98% 99% 98% 99%     General:  Appears calm and comfortable and is in NAD Eyes:  PERRL, EOMI, normal lids, iris ENT:  grossly normal hearing, lips & tongue, mmm; appropriate dentition Neck:  no LAD, masses or thyromegaly Cardiovascular:  Irregularly irregular, no m/r/g. No LE edema.  Respiratory:   CTA bilaterally with no wheezes/rales/rhonchi.  Normal respiratory effort. Abdomen:  soft, NT, ND Skin:  no rash or induration seen on limited exam Musculoskeletal:  grossly normal tone BUE/BLE, good ROM, no bony abnormality Psychiatric:  grossly normal mood and affect, speech fluent and appropriate, AOx3 Neurologic:  CN 2-12 grossly intact, moves all extremities in coordinated fashion    Radiological Exams on Admission: Independently reviewed - see discussion in A/P where applicable  No results found.  EKG: Independently reviewed.  Afib with rate 80; no evidence of acute ischemia   Labs on Admission: I have personally reviewed the available labs and imaging studies at the time of the admission.  Pertinent labs:   Na++ 120 K+ 2.6 Glucose 122 WBC 3.6 UA: moderate Hgb, >80 ketones, 30  protein H pylori positive on 12/13 COVID POSITIVE   Assessment/Plan Principal Problem:   Dehydration with hyponatremia Active Problems:   Essential (primary) hypertension   Mixed hyperlipidemia   New onset atrial fibrillation (HCC)   H. pylori infection   COVID-19 virus infection   Hypokalemia due to excessive gastrointestinal loss of potassium   * Dehydration with hyponatremia -Etiology appears to be hypovolemic hyponatremia; patient with  recent GI symptoms and decreased PO intake.  -This is likely related to H pylori infection, but she also tested positive for COVID-19. -With diarrhea, C diff is also a consideration; will test for this as well. -Na++ was previously normal, >80 ketones on UA -Will hydrate with NS at 100 cc/hr -Will provide clear liquids for now - although patient reports no interest in food/drink at this time  Hypokalemia due to excessive gastrointestinal loss of potassium -Repleted in ER with 40 mEq PO. -She was also given 20 mEq/L in IVF   -Will follow.   -Mag also replaced and home Mag resumed  COVID-19 virus infection -Patient with recent diagnosis of H pylori and shortly after starting treatment she developed severe acute GI symptoms -While GI symptoms could be related to medication side effect, unexpected diagnosis of COVID may also explain her symptoms. -She does not have an O2 requirement -COVID POSITIVE -Will check COVID labs and CXR -Will admit for further evaluation, close monitoring, and treatment -Monitor on telemetry x at least 24 hours -At this time, will attempt to avoid use of aerosolized medications and use HFAs instead if needed -Will check daily labs including BMP with Mag, Phos; LFTs; CBC with differential; CRP; ferritin; fibrinogen; D-dimer -Will order steroids and Remdesivir (pharmacy consult) given +COVID test and symptoms -Encourage mobilization/ambulation as much as possible  H. pylori infection -Recently treated with Flagyl,  tetracycline, Protonix, Pepto -Will hold for now given COVID, n/v/d -Patient briefly discussed with GI -She has f/u scheduled for next week and can revisit medications at that time -C diff checked per GI recommendation -Can formally consult GI if symptoms not improving  New onset atrial fibrillation ([place]) -Patient with prior h/o PACs but no known h/o afib -She is rate controlled, even though she is not taking rate-controlling medications -Will start Eliquis -Counseling/education provided -Has f/u scheduled with Dr. Irish Lack in January  Mixed hyperlipidemia -Continue Mevacor (pravastatin per formulary)  Essential (primary) hypertension -Hold Lisinopril and HCTZ -Will cover with prn IV hydralazine       Note: This patient has been tested and is POSITIVE for the novel coronavirus COVID-19. The patient has been fully vaccinated against COVID-19.   Level of care: Telemetry Medical DVT prophylaxis: Eliquis Code Status: DNR - confirmed with patient/family Family Communication: Son was present throughout evaluation Disposition Plan:  The patient is from: home  Anticipated d/c is to: home without [facility] services   Anticipated d/c date will depend on clinical response to treatment, but possibly as early as tomorrow if she has excellent response to treatment  Patient is currently: acutely ill Consults called: GI by telephone only  Admission status:  Admit - It is my clinical opinion that admission to INPATIENT is reasonable and necessary because of the expectation that this patient will require hospital care that crosses at least 2 midnights to treat this condition based on the medical complexity of the problems presented.  Given the aforementioned information, the predictability of an adverse outcome is felt to be significant.    Karmen Bongo MD [facility]   How to contact the [facility] Attending or Consulting provider [place] or covering provider during after hours [place], for  this patient?  Check the care team in [facility] and look for a) attending/consulting [facility] provider listed and b) the [facility] team listed Log into [email or web address] and use [place]'s universal password to access. If you do not have the password, please contact the hospital operator. Locate the [facility] provider you  are looking for under [facility] and page to a number that you can be directly reached. If you still have difficulty reaching the provider, please page the [facility] (Director on Call) for the Hospitalists listed on amion for assistance.   08/13/2021, 10:14 AM

## 2021-08-13 NOTE — Assessment & Plan Note (Signed)
-  Patient with recent diagnosis of H pylori and shortly after starting treatment she developed severe acute GI symptoms -While GI symptoms could be related to medication side effect, unexpected diagnosis of COVID may also explain her symptoms. -She does not have an O2 requirement -COVID POSITIVE -Will check COVID labs and CXR -Will admit for further evaluation, close monitoring, and treatment -Monitor on telemetry x at least 24 hours -At this time, will attempt to avoid use of aerosolized medications and use HFAs instead if needed -Will check daily labs including BMP with Mag, Phos; LFTs; CBC with differential; CRP; ferritin; fibrinogen; D-dimer -Will order steroids and Remdesivir (pharmacy consult) given +COVID test and symptoms -Encourage mobilization/ambulation as much as possible

## 2021-08-13 NOTE — ED Triage Notes (Signed)
Patient arrives from home with report of abdominal discomfort with N/V/D x 2 days. Patient reports being treated for an intestinal infection, states she was put on protonix, omeprazole, and flagyl. Pt reports since starting the new medication she has been unable to keep anything on her stomach.

## 2021-08-13 NOTE — Assessment & Plan Note (Signed)
-  Recently treated with Flagyl, tetracycline, Protonix, Pepto -Will hold for now given COVID, n/v/d -Patient briefly discussed with GI -She has f/u scheduled for next week and can revisit medications at that time -C diff checked per GI recommendation -Can formally consult GI if symptoms not improving

## 2021-08-13 NOTE — Assessment & Plan Note (Signed)
-  Repleted in ER with 40 mEq PO. -She was also given 20 mEq/L in IVF   -Will follow.   -Mag also replaced and home Mag resumed

## 2021-08-13 NOTE — Assessment & Plan Note (Signed)
-  Continue Mevacor (pravastatin per formulary) ?

## 2021-08-13 NOTE — Assessment & Plan Note (Signed)
-  Patient with prior h/o PACs but no known h/o afib -She is rate controlled, even though she is not taking rate-controlling medications -Will start Eliquis -Counseling/education provided -Has f/u scheduled with Dr. Irish Lack in January

## 2021-08-13 NOTE — Assessment & Plan Note (Addendum)
-  Etiology appears to be hypovolemic hyponatremia; patient with recent GI symptoms and decreased PO intake.  -This is likely related to H pylori infection, but she also tested positive for COVID-19. -With diarrhea, C diff is also a consideration; will test for this as well. -Na++ was previously normal, >80 ketones on UA -Will hydrate with NS at 100 cc/hr -Will provide clear liquids for now - although patient reports no interest in food/drink at this time

## 2021-08-13 NOTE — Progress Notes (Signed)
°  Echocardiogram 2D Echocardiogram has been performed.  Kayla Bass 08/13/2021, 3:39 PM

## 2021-08-13 NOTE — ED Notes (Signed)
Pt ambulatory to bathroom

## 2021-08-13 NOTE — Assessment & Plan Note (Signed)
-  Hold Lisinopril and HCTZ -Will cover with prn IV hydralazine

## 2021-08-13 NOTE — ED Provider Notes (Signed)
[facility] EMERGENCY DEPARTMENT Provider Note   CSN: [id number] Arrival date & time: 08/13/21  0309     History Chief Complaint  Patient presents with   Abdominal Pain    Kayla Bass is a 87 y.o. female.  The history is provided by the patient, a relative and medical records.  Abdominal Pain Kayla Bass is a 87 y.o. female who presents to the Emergency Department complaining of vomiting, diarrhea.  She presents emergency department accompanied by her son for evaluation of vomiting and diarrhea that started 48 hours ago.  She has been experiencing issues with frequent belching and malaise and was seen by GI.  She was diagnosed with H. pylori infection and started on multiple medications 2 days ago.  Shortly after starting the medication she developed profound nausea, vomiting, diarrhea.  She reports associated mild abdominal soreness.  No fevers.  She has associated generalized weakness.    Past Medical History:  Diagnosis Date   early melanoma of left arm    Essential (primary) hypertension    Irregular heartbeat    Microscopic hematuria    Mixed hyperlipidemia    Osteoporosis     Patient Active Problem List   Diagnosis Date Noted   Gastroesophageal reflux disease without esophagitis 07/18/2021   Mixed hyperlipidemia 07/18/2021   Osteoporosis 07/18/2021   Other seasonal allergic rhinitis 07/18/2021   Personal history of malignant melanoma of skin 07/18/2021   Abdominal pain, epigastric 07/18/2021   Burping 07/18/2021   PAC (premature atrial contraction) 11/09/2016   Essential (primary) hypertension 11/09/2016    Past Surgical History:  Procedure Laterality Date   APPENDECTOMY     MOLE REMOVAL     cancerous   TOE SURGERY Right    TUBAL LIGATION Bilateral      OB History   No obstetric history on file.     Family History  Problem Relation Age of Onset   Alzheimer's disease Mother    Suicidality Father    Healthy Sister     CAD Brother    CAD Brother    CAD Brother    Healthy Sister    Healthy Sister    Healthy Daughter    Healthy Son     Social History   Tobacco Use   Smoking status: Never   Smokeless tobacco: Never  Vaping Use   Vaping Use: Never used  Substance Use Topics   Alcohol use: Yes    Comment: occassionally    Home Medications Prior to Admission medications   Medication Sig Start Date End Date Taking? Authorizing Provider  Biotin 5 MG CAPS Take 5 mg by mouth daily.    [provider]  bismuth subsalicylate (PEPTO-BISMOL) 262 MG chewable tablet Chew 2 tablets (524 mg total) by mouth 4 (four) times daily for 14 days. 08/09/21 08/23/21  Sharyn Creamer, MD  CALCIUM PO Take by mouth daily. As directed    [provider]  Cholecalciferol (VITAMIN D-3 PO) Take 2,800 Units by mouth daily.    [provider]  famotidine (PEPCID) 20 MG tablet Take 1 tablet (20 mg total) by mouth at bedtime. 07/18/21   Noralyn Pick, NP  hydrochlorothiazide (HYDRODIURIL) 25 MG tablet Take 25 mg by mouth daily. Takes 1/2 daily    [provider]  lisinopril (PRINIVIL,ZESTRIL) 10 MG tablet Take 10 mg by mouth daily.    [provider]  lovastatin (MEVACOR) 40 MG tablet Take 40 mg by mouth at bedtime.  [provider]  LUTEIN PO Take 1 capsule by mouth daily.     [provider]  Magnesium 200 MG TABS Take 250 mg by mouth 2 (two) times daily.    [provider]  metroNIDAZOLE (FLAGYL) 250 MG tablet Take 1 tablet (250 mg total) by mouth 4 (four) times daily for 14 days. 08/09/21 08/23/21  Sharyn Creamer, MD  pantoprazole (PROTONIX) 40 MG tablet Take 1 tablet (40 mg total) by mouth 2 (two) times daily before a meal. Take 30 mins. before meals 08/03/21   Sharyn Creamer, MD  tetracycline (SUMYCIN) 500 MG capsule Take 1 capsule (500 mg total) by mouth 4 (four) times daily for 14 days. 08/09/21 08/23/21  Sharyn Creamer, MD    Allergies     Alendronate, Benicar [olmesartan], and Other  Review of Systems   Review of Systems  Gastrointestinal:  Positive for abdominal pain.  All other systems reviewed and are negative.  Physical Exam Updated Vital Signs BP (!) 163/84    Pulse 67    Temp (!) 97.5 F (36.4 C) (Oral)    Resp 14    SpO2 100%   Physical Exam Vitals and nursing note reviewed.  Constitutional:      Appearance: She is well-developed.  HENT:     Head: Normocephalic and atraumatic.  Cardiovascular:     Rate and Rhythm: Normal rate and regular rhythm.     Heart sounds: No murmur heard. Pulmonary:     Effort: Pulmonary effort is normal. No respiratory distress.     Breath sounds: Normal breath sounds.  Abdominal:     Palpations: Abdomen is soft.     Tenderness: There is no guarding or rebound.     Comments: Mild generalized abdominal tenderness  Musculoskeletal:        General: No tenderness.  Skin:    General: Skin is warm and dry.  Neurological:     Mental Status: She is alert and oriented to person, place, and time.  Psychiatric:        Behavior: Behavior normal.    ED Results / Procedures / Treatments   Labs (all labs ordered are listed, but only abnormal results are displayed) Labs Reviewed  COMPREHENSIVE METABOLIC PANEL - Abnormal; Notable for the following components:      Result Value   Sodium 121 (*)    Potassium 2.8 (*)    Chloride 82 (*)    Glucose, Bld 135 (*)    BUN 6 (*)    All other components within normal limits  CBC - Abnormal; Notable for the following components:   WBC 3.3 (*)    All other components within normal limits  RESP PANEL BY RT-PCR (FLU A&B, COVID) ARPGX2  LIPASE, BLOOD  URINALYSIS, ROUTINE W REFLEX MICROSCOPIC  MAGNESIUM    EKG EKG Interpretation  Date/Time:  Sunday August 13 2021 06:44:40 EST Ventricular Rate:  80 PR Interval:    QRS Duration: 89 QT Interval:  420 QTC Calculation: 423 R Axis:   22 Text Interpretation: Atrial fibrillation No  previous ECGs available Confirmed by Quintella Reichert [phone]) on 08/13/2021 6:57:11 AM  Radiology No results found.  Procedures Procedures   Medications Ordered in ED Medications  potassium chloride SA (KLOR-CON M) CR tablet 40 mEq (has no administration in time range)  ondansetron (ZOFRAN-ODT) disintegrating tablet 4 mg (4 mg Oral Given 08/13/21 0339)  lactated ringers bolus 1,000 mL (1,000 mLs Intravenous New Bag/Given 08/13/21 0646)    ED  Course  I have reviewed the triage vital signs and the nursing notes.  Pertinent labs & imaging results that were available during my care of the patient were reviewed by me and considered in my medical decision making (see chart for details).    MDM Rules/Calculators/A&P                         Patient here for evaluation of vomiting, diarrhea and generalized weakness in setting of recently starting antibiotics for H. pylori.  She is nontoxic-appearing on evaluation but does appear dehydrated.  Labs significant for hyponatremia, hypokalemia.  She was treated with IV fluid hydration.  Given severity of her electrolyte abnormalities recommend admission for ongoing hydration and observation.  Medicine consulted for admission for ongoing treatment.    Final Clinical Impression(s) / ED Diagnoses Final diagnoses:  Hyponatremia    Rx / DC Orders ED Discharge Orders     None        Quintella Reichert, MD 08/13/21 [phone]

## 2021-08-14 ENCOUNTER — Inpatient Hospital Stay (HOSPITAL_COMMUNITY): Payer: Medicare Other

## 2021-08-14 ENCOUNTER — Telehealth: Payer: Self-pay

## 2021-08-14 LAB — CBC WITH DIFFERENTIAL/PLATELET
Abs Immature Granulocytes: 0.01 10*3/uL (ref 0.00–0.07)
Basophils Absolute: 0 10*3/uL (ref 0.0–0.1)
Basophils Relative: 0 %
Eosinophils Absolute: 0 10*3/uL (ref 0.0–0.5)
Eosinophils Relative: 0 %
HCT: 43 % (ref 36.0–46.0)
Hemoglobin: 15 g/dL (ref 12.0–15.0)
Immature Granulocytes: 0 %
Lymphocytes Relative: 19 %
Lymphs Abs: 0.7 10*3/uL (ref 0.7–4.0)
MCH: 31.4 pg (ref 26.0–34.0)
MCHC: 34.9 g/dL (ref 30.0–36.0)
MCV: 90.1 fL (ref 80.0–100.0)
Monocytes Absolute: 0.1 10*3/uL (ref 0.1–1.0)
Monocytes Relative: 4 %
Neutro Abs: 2.7 10*3/uL (ref 1.7–7.7)
Neutrophils Relative %: 77 %
Platelets: 155 10*3/uL (ref 150–400)
RBC: 4.77 MIL/uL (ref 3.87–5.11)
RDW: 12.3 % (ref 11.5–15.5)
WBC: 3.5 10*3/uL — ABNORMAL LOW (ref 4.0–10.5)
nRBC: 0 % (ref 0.0–0.2)

## 2021-08-14 LAB — COMPREHENSIVE METABOLIC PANEL
ALT: 20 U/L (ref 0–44)
AST: 42 U/L — ABNORMAL HIGH (ref 15–41)
Albumin: 3.4 g/dL — ABNORMAL LOW (ref 3.5–5.0)
Alkaline Phosphatase: 44 U/L (ref 38–126)
Anion gap: 7 (ref 5–15)
BUN: 9 mg/dL (ref 8–23)
CO2: 25 mmol/L (ref 22–32)
Calcium: 8.2 mg/dL — ABNORMAL LOW (ref 8.9–10.3)
Chloride: 98 mmol/L (ref 98–111)
Creatinine, Ser: 0.83 mg/dL (ref 0.44–1.00)
GFR, Estimated: >60 mL/min (ref >60)
Glucose, Bld: 143 mg/dL — ABNORMAL HIGH (ref 70–99)
Potassium: 4.1 mmol/L (ref 3.5–5.1)
Sodium: 130 mmol/L — ABNORMAL LOW (ref 135–145)
Total Bilirubin: 0.7 mg/dL (ref 0.3–1.2)
Total Protein: 5.9 g/dL — ABNORMAL LOW (ref 6.5–8.1)

## 2021-08-14 LAB — C DIFFICILE QUICK SCREEN W PCR REFLEX
C Diff antigen: NEGATIVE
C Diff interpretation: NOT DETECTED
C Diff toxin: NEGATIVE

## 2021-08-14 LAB — BASIC METABOLIC PANEL
Anion gap: 10 (ref 5–15)
BUN: 8 mg/dL (ref 8–23)
CO2: 25 mmol/L (ref 22–32)
Calcium: 8.4 mg/dL — ABNORMAL LOW (ref 8.9–10.3)
Chloride: 98 mmol/L (ref 98–111)
Creatinine, Ser: 0.86 mg/dL (ref 0.44–1.00)
GFR, Estimated: >60 mL/min (ref >60)
Glucose, Bld: 167 mg/dL — ABNORMAL HIGH (ref 70–99)
Potassium: 3.2 mmol/L — ABNORMAL LOW (ref 3.5–5.1)
Sodium: 133 mmol/L — ABNORMAL LOW (ref 135–145)

## 2021-08-14 LAB — URINALYSIS, MICROSCOPIC (REFLEX)

## 2021-08-14 LAB — URINALYSIS, ROUTINE W REFLEX MICROSCOPIC
Bilirubin Urine: NEGATIVE
Glucose, UA: NEGATIVE mg/dL
Ketones, ur: 15 mg/dL — AB
Leukocytes,Ua: NEGATIVE
Nitrite: NEGATIVE
Protein, ur: NEGATIVE mg/dL
Specific Gravity, Urine: 1.02 (ref 1.005–1.030)
pH: 7 (ref 5.0–8.0)

## 2021-08-14 LAB — OSMOLALITY, URINE: Osmolality, Ur: 486 mOsm/kg (ref 300–900)

## 2021-08-14 LAB — D-DIMER, QUANTITATIVE: D-Dimer, Quant: 0.65 ug/mL-FEU — ABNORMAL HIGH (ref 0.00–0.50)

## 2021-08-14 LAB — FERRITIN: Ferritin: 128 ng/mL (ref 11–307)

## 2021-08-14 LAB — CREATININE, URINE, RANDOM: Creatinine, Urine: 54.81 mg/dL

## 2021-08-14 LAB — SODIUM, URINE, RANDOM: Sodium, Ur: 113 mmol/L

## 2021-08-14 LAB — MAGNESIUM: Magnesium: 2 mg/dL (ref 1.7–2.4)

## 2021-08-14 LAB — PHOSPHORUS: Phosphorus: 2.1 mg/dL — ABNORMAL LOW (ref 2.5–4.6)

## 2021-08-14 LAB — OSMOLALITY: Osmolality: 280 mOsm/kg (ref 275–295)

## 2021-08-14 LAB — URIC ACID: Uric Acid, Serum: 2.3 mg/dL — ABNORMAL LOW (ref 2.5–7.1)

## 2021-08-14 LAB — C-REACTIVE PROTEIN: CRP: <0.5 mg/dL (ref <1.0)

## 2021-08-14 MED ORDER — DEXAMETHASONE 4 MG PO TABS
6.0000 mg | ORAL_TABLET | Freq: Every day | ORAL | Status: DC
  Administered 2021-08-14: 09:00:00 6 mg via ORAL
  Filled 2021-08-14: qty 2

## 2021-08-14 MED ORDER — METHYLPREDNISOLONE SODIUM SUCC 40 MG IJ SOLR: 40.0000 mg | Freq: Two times a day (BID) | INTRAMUSCULAR | Status: DC

## 2021-08-14 MED ORDER — POTASSIUM PHOSPHATES 15 MMOLE/5ML IV SOLN
30.0000 mmol | Freq: Once | INTRAVENOUS | Status: AC
  Administered 2021-08-14: 09:00:00 30 mmol via INTRAVENOUS
  Filled 2021-08-14: qty 10

## 2021-08-14 MED ORDER — FAMOTIDINE 20 MG PO TABS
40.0000 mg | ORAL_TABLET | Freq: Every day | ORAL | Status: DC
  Administered 2021-08-14 – 2021-08-16 (×3): 40 mg via ORAL
  Filled 2021-08-14 (×3): qty 2

## 2021-08-14 MED ORDER — POTASSIUM CHLORIDE CRYS ER 20 MEQ PO TBCR
40.0000 meq | EXTENDED_RELEASE_TABLET | Freq: Once | ORAL | Status: AC
  Administered 2021-08-14: 16:00:00 40 meq via ORAL
  Filled 2021-08-14: qty 2

## 2021-08-14 MED ORDER — METOPROLOL TARTRATE 50 MG PO TABS
50.0000 mg | ORAL_TABLET | Freq: Two times a day (BID) | ORAL | Status: DC
  Administered 2021-08-14 – 2021-08-15 (×3): 50 mg via ORAL
  Filled 2021-08-14 (×3): qty 1
  Filled 2021-08-14: qty 2

## 2021-08-14 NOTE — Telephone Encounter (Signed)
-----   Message from [place], [place] sent at 08/14/2021 12:30 PM EST ----- Regarding: Needs follow up Can you please get patient follow up in clinic in 3-4 weeks with an APP or Dr. Lorenso Courier to discuss h.pylori tx- thanks-JLL

## 2021-08-14 NOTE — Telephone Encounter (Signed)
Patient has been scheduled for a hospital follow up with Dr. Lorenso Courier on Wednesday, 09/13/21 at 11:10 am. Anderson Malta notified of patient's appt. Appt information will be on discharge papers and a letter has been mailed to patient with appt information.  Left pt a detailed vm with her appt information, advised that her appt information will be available on my chart as well.

## 2021-08-14 NOTE — Telephone Encounter (Signed)
Patient has been scheduled for a follow up with Dr. Lorenso Courier on 09/13/21 at 11:10 am. Anderson Malta is aware of appt. Letter mailed to pt with appt information.  Left detailed vm for pt with her appt information. Her son returned call and is OK with appt.

## 2021-08-14 NOTE — Progress Notes (Signed)
PROGRESS NOTE                                                                                                                                                                                                             Patient Demographics:    Kayla Bass, is a 87 y.o. female, DOB - 03/22/1932, OEU:[id number]  Outpatient Primary MD for the patient is Kathyrn Lass, MD    LOS - 1  Admit date - 08/13/2021    Chief Complaint  Patient presents with   Abdominal Pain       Brief Narrative (HPI from H&P)   Kayla Bass is a 87 y.o. female with medical history significant of HTN; HLD; ?afib; she has been recently suffering from severe gastritis and was diagnosed with H. pylori antigen in her stool by her gastroenterologist few days ago.  She was subsequently placed on H. pylori treatment, within 2 days of treatment she developed severe nausea vomiting and diarrhea and came to the hospital where she had severe dehydration with electrolyte abnormalities severe hyponatremia and she was admitted, she also had incidental COVID infection.   Subjective:    Kayla Bass today has, No headache, No chest pain, No abdominal pain - No Nausea, No new weakness tingling or numbness, nausea vomiting diarrhea much improved   Assessment  & Plan :     Severe diarrhea causing severe dehydration, hyponatremia hypokalemia and hypophosphatemia - her slightly caused by H. pylori treatment medications versus COVID-19 infection.  She has been hydrated with IV fluids electrolytes replaced, her diarrhea much improved doubt she has C. difficile but results are pending.  We will request GI to evaluate as well.  2.  H. pylori antigen positive stool.  Defer management to GI  3.  Newly diagnosed paroxysmal A. fib with RVR.  Likely due to dehydration, [place] vas 2 score of greater than 3.  Placed on Eliquis and Lopressor.  Echocardiogram nonacute, TSH  stable.  Continue to monitor.  Outpatient cardiology follow-up.  4.  Dyslipidemia.  On statin continue.  5.  Incidental COVID-19 infection.  Stable CRP.  Monitor.      Condition - Fair  Family Communication  :  [name] [phone] - 08/14/21  Code Status :  DNR  Consults  : GI  PUD Prophylaxis : Pepcid   Procedures  :  TTE - 1. Left ventricular ejection fraction, by estimation, is 65 to 70%. Left ventricular ejection fraction by 3D volume is 66 %. The left ventricle has normal function. The left ventricle has no regional wall motion abnormalities. Left ventricular diastolic  function could not be evaluated.  2. Right ventricular systolic function is normal. The right ventricular size is normal. There is normal pulmonary artery systolic pressure.  3. Trivial mitral valve regurgitation.  4. The aortic valve is normal in structure. Aortic valve regurgitation is mild. No aortic stenosis is present.        Disposition Plan  :    Status is: Inpatient  Remains inpatient appropriate because: Severe dehydration and hyponatremia    DVT Prophylaxis  :    apixaban (ELIQUIS) tablet 2.5 mg Start: 08/13/21 1000 apixaban (ELIQUIS) tablet 2.5 mg      Lab Results  Component Value Date   PLT 155 08/14/2021    Diet :  Diet Order             DIET SOFT Room service appropriate? Yes; Fluid consistency: Thin  Diet effective now                    Inpatient Medications  Scheduled Meds:  apixaban  2.5 mg Oral BID   vitamin C  500 mg Oral Daily   famotidine  40 mg Oral Daily   metoprolol tartrate  50 mg Oral BID   multivitamin  1 tablet Oral Daily   pravastatin  40 mg Oral q1800   sodium chloride flush  3 mL Intravenous Q12H   zinc sulfate  220 mg Oral Daily   Continuous Infusions:  potassium PHOSPHATE IVPB (in mmol) 30 mmol (08/14/21 0919)   remdesivir 100 mg in NS 100 mL     PRN Meds:.acetaminophen **OR** acetaminophen, albuterol, hydrALAZINE, ondansetron **OR**  ondansetron (ZOFRAN) IV  Antibiotics  :    Anti-infectives (From admission, onward)    Start     Dose/Rate Route Frequency Ordered Stop   08/14/21 1000  remdesivir 100 mg in sodium chloride 0.9 % 100 mL IVPB       See Hyperspace for full Linked Orders Report.   100 mg 200 mL/hr over 30 Minutes Intravenous Daily 08/13/21 1007 08/18/21 0959   08/13/21 1015  remdesivir 200 mg in sodium chloride 0.9% 250 mL IVPB       See Hyperspace for full Linked Orders Report.   200 mg 580 mL/hr over 30 Minutes Intravenous Once 08/13/21 1007 08/13/21 1224        Time Spent in minutes  30   Kayla Bass M.D on 08/14/2021 at 10:05 AM  To page go to [email or web address]   [facility] -  Office  [phone]  See all Orders from today for further details    Objective:   Vitals:   08/14/21 0200 08/14/21 0300 08/14/21 0654 08/14/21 0656  BP: (!) 144/59 (!) 139/59 (!) 152/65 (!) 152/65  Pulse: 66 (!) 25  62  Resp: 17 16 16 16   Temp:      TempSrc:      SpO2: 97% 97% 97% 97%    Wt Readings from Last 3 Encounters:  08/13/21 56.7 kg  07/18/21 58.3 kg  11/20/17 59.4 kg    No intake or output data in the 24 hours ending 08/14/21 1005   Physical Exam  Awake Alert, No new F.N deficits, Normal affect [place].AT,PERRAL Supple Neck, No JVD,   Symmetrical Chest wall movement, Good air  movement bilaterally, CTAB RRR,No Gallops,Rubs or new Murmurs,  +ve B.Sounds, Abd Soft, No tenderness,   No Cyanosis, Clubbing or edema       Data Review:    CBC Recent Labs  Lab 08/13/21 0344 08/13/21 0345 08/14/21 0429  WBC 3.3* 3.6* 3.5*  HGB 14.9 15.4* 15.0  HCT 42.8 43.5 43.0  PLT 171 167 155  MCV 87.3 87.2 90.1  MCH 30.4 30.9 31.4  MCHC 34.8 35.4 34.9  RDW 11.9 12.0 12.3  LYMPHSABS  --   --  0.7  MONOABS  --   --  0.1  EOSABS  --   --  0.0  BASOSABS  --   --  0.0    Electrolytes Recent Labs  Lab 08/13/21 0344 08/13/21 0345 08/13/21 1230 08/14/21 0429  NA 121* 120*  --  130*  K  2.8* 2.6*  --  4.1  CL 82* 83*  --  98  CO2 27 26  --  25  GLUCOSE 135* 122*  --  143*  BUN 6* 7*  --  9  CREATININE 0.79 0.78  --  0.83  CALCIUM 9.0 8.8*  --  8.2*  AST 35 36  --  42*  ALT 19 19  --  20  ALKPHOS 55 51  --  44  BILITOT 1.0 0.8  --  0.7  ALBUMIN 4.0 4.3  --  3.4*  MG 1.5*  --   --  2.0  CRP  --   --  <0.5 <0.5  DDIMER  --   --  0.61* 0.65*  PROCALCITON  --   --  <0.10  --   TSH  --   --  4.978*  --     ------------------------------------------------------------------------------------------------------------------ No results for input(s): CHOL, HDL, LDLCALC, TRIG, CHOLHDL, LDLDIRECT in the last 72 hours.  No results found for: HGBA1C  Recent Labs    08/13/21 1230  TSH 4.978*   ------------------------------------------------------------------------------------------------------------------ ID Labs Recent Labs  Lab 08/13/21 0344 08/13/21 0345 08/13/21 1230 08/14/21 0429  WBC 3.3* 3.6*  --  3.5*  PLT 171 167  --  155  CRP  --   --  <0.5 <0.5  DDIMER  --   --  0.61* 0.65*  PROCALCITON  --   --  <0.10  --   CREATININE 0.79 0.78  --  0.83   Cardiac Enzymes No results for input(s): CKMB, TROPONINI, MYOGLOBIN in the last 168 hours.  Invalid input(s): CK   Radiology Reports DG Chest Port 1 View  Result Date: 08/14/2021 CLINICAL DATA:  Shortness of breath over the last 2 weeks. Coronavirus infection. EXAM: PORTABLE CHEST 1 VIEW COMPARISON:  None. FINDINGS: Mild cardiomegaly. Aortic atherosclerotic calcification. Numerous calcified granulomas in the lower lungs, more numerous on the right than the left. No sign infiltrate, collapse or effusion. No focal bone finding. IMPRESSION: No active cardiopulmonary disease. Mild cardiomegaly.  Aortic atherosclerosis. Numerous calcified granulomas in the lower lungs, more numerous on the right than the left Electronically Signed   By: Nelson Chimes M.D.   On: 08/14/2021 08:11   ECHOCARDIOGRAM LIMITED  Result Date:  08/13/2021    ECHOCARDIOGRAM LIMITED REPORT   Patient Name:   EMMI WERTHEIM Date of Exam: 08/13/2021 Medical Rec #:  [id number]         Height:       65.0 in Accession #:    [id number]        Weight:       125.0 lb  Date of Birth:  1931/11/29          BSA:          1.620 m Patient Age:    45 years          BP:           121/70 mmHg Patient Gender: F                 HR:           71 bpm. Exam Location:  Inpatient Procedure: Limited Echo, Cardiac Doppler and Color Doppler Indications:    I48.91* Unspeicified atrial fibrillation  History:        Patient has no prior history of Echocardiogram examinations.                 Abnormal ECG, Arrythmias:Atrial Fibrillation; Risk                 Factors:Hypertension and Dyslipidemia. Covid positive.  Sonographer:    Roseanna Rainbow RDCS Referring Phys: [id number] JENNIFER YATES  Sonographer Comments: Technically difficult study due to poor echo windows. IMPRESSIONS  1. Left ventricular ejection fraction, by estimation, is 65 to 70%. Left ventricular ejection fraction by 3D volume is 66 %. The left ventricle has normal function. The left ventricle has no regional wall motion abnormalities. Left ventricular diastolic  function could not be evaluated.  2. Right ventricular systolic function is normal. The right ventricular size is normal. There is normal pulmonary artery systolic pressure.  3. Trivial mitral valve regurgitation.  4. The aortic valve is normal in structure. Aortic valve regurgitation is mild. No aortic stenosis is present. FINDINGS  Left Ventricle: Left ventricular ejection fraction, by estimation, is 65 to 70%. Left ventricular ejection fraction by 3D volume is 66 %. The left ventricle has normal function. The left ventricle has no regional wall motion abnormalities. The left ventricular internal cavity size was normal in size. There is no left ventricular hypertrophy. Left ventricular diastolic function could not be evaluated. Right Ventricle: The right ventricular size is  normal. Right vetricular wall thickness was not well visualized. Right ventricular systolic function is normal. There is normal pulmonary artery systolic pressure. The tricuspid regurgitant velocity is 2.27 m/s, and with an assumed right atrial pressure of 3 mmHg, the estimated right ventricular systolic pressure is 40.1 mmHg. Left Atrium: Left atrial size was normal in size. Right Atrium: Right atrial size was normal in size. Pericardium: There is no evidence of pericardial effusion. Mitral Valve: Trivial mitral valve regurgitation. Tricuspid Valve: The tricuspid valve is not well visualized. Tricuspid valve regurgitation is mild. Aortic Valve: The aortic valve is normal in structure. Aortic valve regurgitation is mild. Aortic regurgitation PHT measures 708 msec. No aortic stenosis is present. Aorta: The aortic root and ascending aorta are structurally normal, with no evidence of dilitation. LEFT VENTRICLE PLAX 2D LVIDd:         4.00 cm LVIDs:         2.40 cm LV PW:         0.90 cm         3D Volume EF LV IVS:        0.80 cm         LV 3D EF:    Left LVOT diam:     2.00 cm                      ventricul LVOT Area:     3.14  cm                     ar                                             ejection                                             fraction                                             by 3D                                             volume is                                             66 %.                                 3D Volume EF:                                3D EF:        66 %                                LV EDV:       81 ml                                LV ESV:       28 ml                                LV SV:        54 ml IVC IVC diam: 1.60 cm LEFT ATRIUM         Index LA diam:    2.10 cm 1.30 cm/m  AORTIC VALVE AI PHT:      708 msec  AORTA Ao Root diam: 3.40 cm Ao Asc diam:  3.60 cm MITRAL VALVE               TRICUSPID VALVE MV Area (PHT): 4.31 cm    TR Peak grad:   20.6 mmHg MV Decel  Time: 176 msec    TR Vmax:        227.00 cm/s MV E velocity: 75.30 cm/s MV A velocity: 64.10 cm/s  SHUNTS MV E/A ratio:  1.17        Systemic Diam: 2.00 cm Mertie Moores MD Electronically signed by Mertie Moores MD Signature Date/Time: 08/13/2021/4:04:16 PM    Final

## 2021-08-14 NOTE — Evaluation (Signed)
Occupational Therapy Evaluation Patient Details Name: Kayla Bass MRN: [id number] DOB: 05-12-1932 Today's Date: 08/14/2021   History of Present Illness Pt is 87 y/o F presenting with dehydration and hyponatremia. Found to have have COVID-19 and H. pylori. PMH includes HTN; HLD; ?afib.   Clinical Impression   Pt presents with slightly decreased dynamic standing balance. Recommend supervision during functional mobility, otherwise independent with ADLs. Should be safe to return home without any further skilled services once medically cleared. Will sign off.     Recommendations for follow up therapy are one component of a multi-disciplinary discharge planning process, led by the attending physician.  Recommendations may be updated based on patient status, additional functional criteria and insurance authorization.   Follow Up Recommendations  No OT follow up    Assistance Recommended at Discharge Set up Supervision/Assistance  Functional Status Assessment  Patient has had a recent decline in their functional status and demonstrates the ability to make significant improvements in function in a reasonable and predictable amount of time.  Equipment Recommendations  None recommended by OT    Recommendations for Other Services       Precautions / Restrictions Precautions Precautions: Other (comment) Precaution Comments: monitor O2 Restrictions Weight Bearing Restrictions: No      Mobility Bed Mobility Overal bed mobility: Independent                  Transfers Overall transfer level: Needs assistance Equipment used: None Transfers: Sit to/from Stand Sit to Stand: Supervision                  Balance Overall balance assessment: Mild deficits observed, not formally tested                                         ADL either performed or assessed with clinical judgement   ADL Overall ADL's : Needs assistance/impaired Eating/Feeding:  Independent   Grooming: Independent   Upper Body Bathing: Independent   Lower Body Bathing: Independent   Upper Body Dressing : Independent   Lower Body Dressing: Independent   Toilet Transfer: Supervision/safety   Toileting- Clothing Manipulation and Hygiene: Independent       Functional mobility during ADLs: Supervision/safety       Vision Patient Visual Report: No change from baseline       Perception     Praxis      Pertinent Vitals/Pain Pain Assessment: No/denies pain     Hand Dominance Right   Extremity/Trunk Assessment Upper Extremity Assessment Upper Extremity Assessment: Overall WFL for tasks assessed   Lower Extremity Assessment Lower Extremity Assessment: Defer to PT evaluation   Cervical / Trunk Assessment Cervical / Trunk Assessment: Normal   Communication Communication Communication: HOH   Cognition Arousal/Alertness: Awake/alert Behavior During Therapy: WFL for tasks assessed/performed Overall Cognitive Status: Within Functional Limits for tasks assessed                                       General Comments       Exercises     Shoulder Instructions      Home Living Family/patient expects to be discharged to:: Private residence Living Arrangements: Children Available Help at Discharge: Family;Available 24 hours/day Type of Home: Apartment (Apartment connected to son's home) Home Access: Level entry  Home Layout: One level     Bathroom Shower/Tub: Other (comment) (Walk-in tub with door and shower)   Bathroom Toilet: Handicapped height     Home Equipment: Shower seat - built in;Grab bars - tub/shower          Prior Functioning/Environment Prior Level of Function : Independent/Modified Independent;Driving                        OT Problem List: Impaired balance (sitting and/or standing)      OT Treatment/Interventions:      OT Goals(Current goals can be found in the care plan section)  Acute Rehab OT Goals Patient Stated Goal: return home OT Goal Formulation: With patient  OT Frequency:     Barriers to D/C:            Co-evaluation              AM-PAC OT "6 Clicks" Daily Activity     Outcome Measure Help from another person eating meals?: None Help from another person taking care of personal grooming?: None Help from another person toileting, which includes using toliet, bedpan, or urinal?: A Little Help from another person bathing (including washing, rinsing, drying)?: None Help from another person to put on and taking off regular upper body clothing?: None Help from another person to put on and taking off regular lower body clothing?: None 6 Click Score: 23   End of Session Nurse Communication: Mobility status  Activity Tolerance: Patient tolerated treatment well Patient left: in bed;with call bell/phone within reach  OT Visit Diagnosis: Unsteadiness on feet (R26.81)                Time: 1209-1228 OT Time Calculation (min): 19 min Charges:  OT General Charges $OT Visit: 1 Visit OT Evaluation $OT Eval Low Complexity: 1 Low  Wildomar Carmack C, OT/L  Acute Rehab [place] 08/14/2021, 1:37 PM

## 2021-08-14 NOTE — Progress Notes (Signed)
Case reviewed. Patient recently initiated on antibiotic therapy for a positive H. pylori stool antigen. Comes into the hospital with nausea/vomiting and also found to have COVID 19. Patient should have H. pylori treatment held for at least the next 2 to 4 weeks. Would allow the patient an opportunity to try and get through her COVID-19 infection. She will be supplemented in regards to her electrolytes as needed by the medicine team. We will set up a follow-up in clinic in the coming weeks to further discuss what H. pylori treatment can be considered.  Talicia and a triple therapy may need to be considered.  If Pat Kocher is considered will need to get insurance approval. She should remain on twice daily PPI in the interim. Other issues develop, we will be happy to evaluate the patient on an inpatient setting. She has follow-up with Dr. Lorenso Courier set up for January 2023.   Justice Britain, MD [facility] Office # [phone]

## 2021-08-14 NOTE — ED Notes (Signed)
Breakfast orders placed 

## 2021-08-14 NOTE — Telephone Encounter (Signed)
-----   Message from [place], [place] sent at 08/14/2021 12:30 PM EST ----- Regarding: Needs follow up Can you please get patient follow up in clinic in 3-4 weeks with an APP or Dr. Lorenso Courier to discuss h.pylori tx- thanks-JLL

## 2021-08-15 ENCOUNTER — Other Ambulatory Visit (HOSPITAL_COMMUNITY): Payer: Self-pay

## 2021-08-15 LAB — CBC WITH DIFFERENTIAL/PLATELET
Abs Immature Granulocytes: 0.02 10*3/uL (ref 0.00–0.07)
Basophils Absolute: 0 10*3/uL (ref 0.0–0.1)
Basophils Relative: 0 %
Eosinophils Absolute: 0 10*3/uL (ref 0.0–0.5)
Eosinophils Relative: 0 %
HCT: 38.8 % (ref 36.0–46.0)
Hemoglobin: 13.6 g/dL (ref 12.0–15.0)
Immature Granulocytes: 0 %
Lymphocytes Relative: 16 %
Lymphs Abs: 1.1 10*3/uL (ref 0.7–4.0)
MCH: 31 pg (ref 26.0–34.0)
MCHC: 35.1 g/dL (ref 30.0–36.0)
MCV: 88.4 fL (ref 80.0–100.0)
Monocytes Absolute: 0.9 10*3/uL (ref 0.1–1.0)
Monocytes Relative: 13 %
Neutro Abs: 5.1 10*3/uL (ref 1.7–7.7)
Neutrophils Relative %: 71 %
Platelets: 156 10*3/uL (ref 150–400)
RBC: 4.39 MIL/uL (ref 3.87–5.11)
RDW: 12.7 % (ref 11.5–15.5)
WBC: 7.2 10*3/uL (ref 4.0–10.5)
nRBC: 0 % (ref 0.0–0.2)

## 2021-08-15 LAB — COMPREHENSIVE METABOLIC PANEL
ALT: 19 U/L (ref 0–44)
AST: 37 U/L (ref 15–41)
Albumin: 3.4 g/dL — ABNORMAL LOW (ref 3.5–5.0)
Alkaline Phosphatase: 38 U/L (ref 38–126)
Anion gap: 7 (ref 5–15)
BUN: 13 mg/dL (ref 8–23)
CO2: 21 mmol/L — ABNORMAL LOW (ref 22–32)
Calcium: 8.4 mg/dL — ABNORMAL LOW (ref 8.9–10.3)
Chloride: 102 mmol/L (ref 98–111)
Creatinine, Ser: 0.7 mg/dL (ref 0.44–1.00)
GFR, Estimated: >60 mL/min (ref >60)
Glucose, Bld: 122 mg/dL — ABNORMAL HIGH (ref 70–99)
Potassium: 4.4 mmol/L (ref 3.5–5.1)
Sodium: 130 mmol/L — ABNORMAL LOW (ref 135–145)
Total Bilirubin: 0.4 mg/dL (ref 0.3–1.2)
Total Protein: 5.7 g/dL — ABNORMAL LOW (ref 6.5–8.1)

## 2021-08-15 LAB — MAGNESIUM: Magnesium: 2 mg/dL (ref 1.7–2.4)

## 2021-08-15 LAB — D-DIMER, QUANTITATIVE: D-Dimer, Quant: 0.74 ug/mL-FEU — ABNORMAL HIGH (ref 0.00–0.50)

## 2021-08-15 LAB — C-REACTIVE PROTEIN: CRP: <0.5 mg/dL (ref <1.0)

## 2021-08-15 LAB — PHOSPHORUS: Phosphorus: 2.2 mg/dL — ABNORMAL LOW (ref 2.5–4.6)

## 2021-08-15 LAB — UREA NITROGEN, URINE: Urea Nitrogen, Ur: 495 mg/dL

## 2021-08-15 MED ORDER — SODIUM PHOSPHATES 45 MMOLE/15ML IV SOLN
30.0000 mmol | Freq: Once | INTRAVENOUS | Status: AC
  Administered 2021-08-15: 12:00:00 30 mmol via INTRAVENOUS
  Filled 2021-08-15: qty 10

## 2021-08-15 MED ORDER — AMLODIPINE BESYLATE 10 MG PO TABS
10.0000 mg | ORAL_TABLET | Freq: Every day | ORAL | Status: DC
  Administered 2021-08-15 – 2021-08-16 (×2): 10 mg via ORAL
  Filled 2021-08-15 (×2): qty 1

## 2021-08-15 MED ORDER — BISMUTH SUBSALICYLATE 262 MG/15ML PO SUSP
30.0000 mL | ORAL | Status: DC | PRN
  Administered 2021-08-15: 20:00:00 30 mL via ORAL
  Filled 2021-08-15 (×2): qty 236

## 2021-08-15 MED ORDER — LACTATED RINGERS IV SOLN: INTRAVENOUS | Status: DC

## 2021-08-15 NOTE — Discharge Instructions (Addendum)
Follow with Primary MD Kathyrn Lass, MD in 7 days   Get CBC, CMP, 2 view Chest X ray -  checked next visit within 1 week by Primary MD    Activity: As tolerated with Full fall precautions use walker/cane & assistance as needed  Disposition Home    Diet: Heart Healthy     Special Instructions: If you have smoked or chewed Tobacco  in the last 2 yrs please stop smoking, stop any regular Alcohol  and or any Recreational drug use.  On your next visit with your primary care physician please Get Medicines reviewed and adjusted.  Please request your Prim.MD to go over all Hospital Tests and Procedure/Radiological results at the follow up, please get all Hospital records sent to your Prim MD by signing hospital release before you go home.  If you experience worsening of your admission symptoms, develop shortness of breath, life threatening emergency, suicidal or homicidal thoughts you must seek medical attention immediately by calling 911 or calling your MD immediately  if symptoms less severe.  You Must read complete instructions/literature along with all the possible adverse reactions/side effects for all the Medicines you take and that have been prescribed to you. Take any new Medicines after you have completely understood and accpet all the possible adverse reactions/side effects.       Person Under Monitoring Name: Kayla Bass  Location: [address] [place] [address]   Infection Prevention Recommendations for Individuals Confirmed to have, or Being Evaluated for, 2019 Novel Coronavirus (COVID-19) Infection Who Receive Care at Home  Individuals who are confirmed to have, or are being evaluated for, COVID-19 should follow the prevention steps below until a healthcare provider or local or state health department says they can return to normal activities.  Stay home except to get medical care You should restrict activities outside your home, except for getting medical  care. Do not go to work, school, or public areas, and do not use public transportation or taxis.  Call ahead before visiting your doctor Before your medical appointment, call the healthcare provider and tell them that you have, or are being evaluated for, COVID-19 infection. This will help the healthcare providers office take steps to keep other people from getting infected. Ask your healthcare provider to call the local or state health department.  Monitor your symptoms Seek prompt medical attention if your illness is worsening (e.g., difficulty breathing). Before going to your medical appointment, call the healthcare provider and tell them that you have, or are being evaluated for, COVID-19 infection. Ask your healthcare provider to call the local or state health department.  Wear a facemask You should wear a facemask that covers your nose and mouth when you are in the same room with other people and when you visit a healthcare provider. People who live with or visit you should also wear a facemask while they are in the same room with you.  Separate yourself from other people in your home As much as possible, you should stay in a different room from other people in your home. Also, you should use a separate bathroom, if available.  Avoid sharing household items You should not share dishes, drinking glasses, cups, eating utensils, towels, bedding, or other items with other people in your home. After using these items, you should wash them thoroughly with soap and water.  Cover your coughs and sneezes Cover your mouth and nose with a tissue when you cough or sneeze, or you can cough  or sneeze into your sleeve. Throw used tissues in a lined trash can, and immediately wash your hands with soap and water for at least 20 seconds or use an alcohol-based hand rub.  Wash your [facility] your hands often and thoroughly with soap and water for at least 20 seconds. You can use an alcohol-based  hand sanitizer if soap and water are not available and if your hands are not visibly dirty. Avoid touching your eyes, nose, and mouth with unwashed hands.   Prevention Steps for Caregivers and Household Members of Individuals Confirmed to have, or Being Evaluated for, COVID-19 Infection Being Cared for in the Home  If you live with, or provide care at home for, a person confirmed to have, or being evaluated for, COVID-19 infection please follow these guidelines to prevent infection:  Follow healthcare providers instructions Make sure that you understand and can help the patient follow any healthcare provider instructions for all care.  Provide for the patients basic needs You should help the patient with basic needs in the home and provide support for getting groceries, prescriptions, and other personal needs.  Monitor the patients symptoms If they are getting sicker, call his or her medical provider and tell them that the patient has, or is being evaluated for, COVID-19 infection. This will help the healthcare providers office take steps to keep other people from getting infected. Ask the healthcare provider to call the local or state health department.  Limit the number of people who have contact with the patient If possible, have only one caregiver for the patient. Other household members should stay in another home or place of residence. If this is not possible, they should stay in another room, or be separated from the patient as much as possible. Use a separate bathroom, if available. Restrict visitors who do not have an essential need to be in the home.  Keep older adults, very young children, and other sick people away from the patient Keep older adults, very young children, and those who have compromised immune systems or chronic health conditions away from the patient. This includes people with chronic heart, lung, or kidney conditions, diabetes, and cancer.  Ensure good  ventilation Make sure that shared spaces in the home have good air flow, such as from an air conditioner or an opened window, weather permitting.  Wash your hands often Wash your hands often and thoroughly with soap and water for at least 20 seconds. You can use an alcohol based hand sanitizer if soap and water are not available and if your hands are not visibly dirty. Avoid touching your eyes, nose, and mouth with unwashed hands. Use disposable paper towels to dry your hands. If not available, use dedicated cloth towels and replace them when they become wet.  Wear a facemask and gloves Wear a disposable facemask at all times in the room and gloves when you touch or have contact with the patients blood, body fluids, and/or secretions or excretions, such as sweat, saliva, sputum, nasal mucus, vomit, urine, or feces.  Ensure the mask fits over your nose and mouth tightly, and do not touch it during use. Throw out disposable facemasks and gloves after using them. Do not reuse. Wash your hands immediately after removing your facemask and gloves. If your personal clothing becomes contaminated, carefully remove clothing and launder. Wash your hands after handling contaminated clothing. Place all used disposable facemasks, gloves, and other waste in a lined container before disposing them with other household waste.  Remove gloves and wash your hands immediately after handling these items.  Do not share dishes, glasses, or other household items with the patient Avoid sharing household items. You should not share dishes, drinking glasses, cups, eating utensils, towels, bedding, or other items with a patient who is confirmed to have, or being evaluated for, COVID-19 infection. After the person uses these items, you should wash them thoroughly with soap and water.  Wash laundry thoroughly Immediately remove and wash clothes or bedding that have blood, body fluids, and/or secretions or excretions, such as  sweat, saliva, sputum, nasal mucus, vomit, urine, or feces, on them. Wear gloves when handling laundry from the patient. Read and follow directions on labels of laundry or clothing items and detergent. In general, wash and dry with the warmest temperatures recommended on the label.  Clean all areas the individual has used often Clean all touchable surfaces, such as counters, tabletops, doorknobs, bathroom fixtures, toilets, phones, keyboards, tablets, and bedside tables, every day. Also, clean any surfaces that may have blood, body fluids, and/or secretions or excretions on them. Wear gloves when cleaning surfaces the patient has come in contact with. Use a diluted bleach solution (e.g., dilute bleach with 1 part bleach and 10 parts water) or a household disinfectant with a label that says EPA-registered for coronaviruses. To make a bleach solution at home, add 1 tablespoon of bleach to 1 quart (4 cups) of water. For a larger supply, add  cup of bleach to 1 gallon (16 cups) of water. Read labels of cleaning products and follow recommendations provided on product labels. Labels contain instructions for safe and effective use of the cleaning product including precautions you should take when applying the product, such as wearing gloves or eye protection and making sure you have good ventilation during use of the product. Remove gloves and wash hands immediately after cleaning.  Monitor yourself for signs and symptoms of illness Caregivers and household members are considered close contacts, should monitor their health, and will be asked to limit movement outside of the home to the extent possible. Follow the monitoring steps for close contacts listed on the symptom monitoring form.   ? If you have additional questions, contact your local health department or call the epidemiologist on call at [phone] (available 24/7). ? This guidance is subject to change. For the most up-to-date guidance from  [facility], please refer to their website: YouBlogs.pl   Information on my medicine - ELIQUIS (apixaban)  This medication education was reviewed with me or my healthcare representative as part of my discharge preparation.  The pharmacist that spoke with me during my hospital stay was:  Arman Bogus, [facility]  Why was Eliquis prescribed for you? Eliquis was prescribed for you to reduce the risk of a blood clot forming that can cause a stroke if you have a medical condition called atrial fibrillation (a type of irregular heartbeat).  What do You need to know about Eliquis ? Take your Eliquis TWICE DAILY - one tablet in the morning and one tablet in the evening with or without food. If you have difficulty swallowing the tablet whole please discuss with your pharmacist how to take the medication safely.  Take Eliquis exactly as prescribed by your doctor and DO NOT stop taking Eliquis without talking to the doctor who prescribed the medication.  Stopping may increase your risk of developing a stroke.  Refill your prescription before you run out.  After discharge, you should have regular check-up appointments with your  healthcare provider that is prescribing your Eliquis.  In the future your dose may need to be changed if your kidney function or weight changes by a significant amount or as you get older.  What do you do if you miss a dose? If you miss a dose, take it as soon as you remember on the same day and resume taking twice daily.  Do not take more than one dose of ELIQUIS at the same time to make up a missed dose.  Important Safety Information A possible side effect of Eliquis is bleeding. You should call your healthcare provider right away if you experience any of the following: Bleeding from an injury or your nose that does not stop. Unusual colored urine (red or dark brown) or unusual colored stools (red or  black). Unusual bruising for unknown reasons. A serious fall or if you hit your head (even if there is no bleeding).  Some medicines may interact with Eliquis and might increase your risk of bleeding or clotting while on Eliquis. To help avoid this, consult your healthcare provider or pharmacist prior to using any new prescription or non-prescription medications, including herbals, vitamins, non-steroidal anti-inflammatory drugs (NSAIDs) and supplements.  This website has more information on Eliquis (apixaban): http://www.eliquis.com/eliquis/home

## 2021-08-15 NOTE — TOC Progression Note (Addendum)
Transition of Care [facility]) - Progression Note    Patient Details  Name: Kayla Bass MRN: [id number] Date of Birth: 11/09/1931  [facility] CM/SW Contact  Zenon Mayo, RN Phone Number: 08/15/2021, 4:31 PM  Clinical Narrative:    from home with son, dehydration, hypokalemia, h-pylori positive stool, having diarrheal, covid positive, conts on ivf's .  TOC will continue to follow for dc needs.        Expected Discharge Plan and Services                                                 Social Determinants of Health (SDOH) Interventions    Readmission Risk Interventions No flowsheet data found.

## 2021-08-15 NOTE — Plan of Care (Signed)

## 2021-08-15 NOTE — Progress Notes (Signed)
PROGRESS NOTE                                                                                                                                                                                                             Patient Demographics:    Kayla Bass, is a 87 y.o. female, DOB - 1932-08-23, BWI:[id number]  Outpatient Primary MD for the patient is Kathyrn Lass, MD    LOS - 2  Admit date - 08/13/2021    Chief Complaint  Patient presents with   Abdominal Pain       Brief Narrative (HPI from H&P)   Kayla Bass is a 87 y.o. female with medical history significant of HTN; HLD; ?afib; she has been recently suffering from severe gastritis and was diagnosed with H. pylori antigen in her stool by her gastroenterologist few days ago.  She was subsequently placed on H. pylori treatment, within 2 days of treatment she developed severe nausea vomiting and diarrhea and came to the hospital where she had severe dehydration with electrolyte abnormalities severe hyponatremia and she was admitted, she also had incidental COVID infection.   Subjective:   Patient in bed, appears comfortable, denies any headache, no fever, no chest pain or pressure, no shortness of breath , no abdominal pain. No new focal weakness.    Assessment  & Plan :     Severe diarrhea causing severe dehydration, hyponatremia hypokalemia and hypophosphatemia - her symptoms are most likely caused by H. pylori treatment medications versus COVID-19 infection.  She has been hydrated with IV fluids, electrolytes replaced, symptoms much improved continue hydration with IV fluids with gradual improvement in sodium levels, advance activity if stable discharge in the next 1 to 2 days.  2.  H. pylori antigen positive stool.  DW GI, plan is to withhold H. pylori medications with 2-week outpatient follow-up with Dr. Lorenso Courier and then reconvene the plan.  3.  Newly diagnosed  paroxysmal A. fib with RVR.  Likely due to dehydration, [place] vas 2 score of greater than 3.  Placed on Eliquis and Lopressor.  Echocardiogram nonacute, TSH stable.  Continue to monitor.  Outpatient cardiology follow-up.  4.  Dyslipidemia.  On statin continue.  5.  Incidental COVID-19 infection.  Stable CRP.  Monitor.  6.  Hypertension.  Blood pressure getting high.  On Lopressor Norvasc added.  Avoiding ACE/ARB due to severe dehydration.      Condition - Fair  Family Communication  :  [name] [phone] - 08/14/21  Code Status :  DNR  Consults  : GI  PUD Prophylaxis : Pepcid   Procedures  :     TTE - 1. Left ventricular ejection fraction, by estimation, is 65 to 70%. Left ventricular ejection fraction by 3D volume is 66 %. The left ventricle has normal function. The left ventricle has no regional wall motion abnormalities. Left ventricular diastolic  function could not be evaluated.  2. Right ventricular systolic function is normal. The right ventricular size is normal. There is normal pulmonary artery systolic pressure.  3. Trivial mitral valve regurgitation.  4. The aortic valve is normal in structure. Aortic valve regurgitation is mild. No aortic stenosis is present.        Disposition Plan  :    Status is: Inpatient  Remains inpatient appropriate because: Severe dehydration and hyponatremia    DVT Prophylaxis  :    Place TED hose Start: 08/15/21 0915 apixaban (ELIQUIS) tablet 2.5 mg Start: 08/13/21 1000 apixaban (ELIQUIS) tablet 2.5 mg      Lab Results  Component Value Date   PLT 156 08/15/2021    Diet :  Diet Order             DIET SOFT Room service appropriate? Yes; Fluid consistency: Thin  Diet effective now                    Inpatient Medications  Scheduled Meds:  amLODipine  10 mg Oral Daily   apixaban  2.5 mg Oral BID   vitamin C  500 mg Oral Daily   famotidine  40 mg Oral Daily   metoprolol tartrate  50 mg Oral BID   multivitamin  1  tablet Oral Daily   pravastatin  40 mg Oral q1800   sodium chloride flush  3 mL Intravenous Q12H   zinc sulfate  220 mg Oral Daily   Continuous Infusions:  lactated ringers     remdesivir 100 mg in NS 100 mL Stopped (08/14/21 1306)   sodium phosphate  Dextrose 5% IVPB     PRN Meds:.acetaminophen **OR** acetaminophen, albuterol, hydrALAZINE, ondansetron **OR** ondansetron (ZOFRAN) IV  Antibiotics  :    Anti-infectives (From admission, onward)    Start     Dose/Rate Route Frequency Ordered Stop   08/14/21 1000  remdesivir 100 mg in sodium chloride 0.9 % 100 mL IVPB       See Hyperspace for full Linked Orders Report.   100 mg 200 mL/hr over 30 Minutes Intravenous Daily 08/13/21 1007 08/18/21 0959   08/13/21 1015  remdesivir 200 mg in sodium chloride 0.9% 250 mL IVPB       See Hyperspace for full Linked Orders Report.   200 mg 580 mL/hr over 30 Minutes Intravenous Once 08/13/21 1007 08/13/21 1224        Time Spent in minutes  30   Lala Lund M.D on 08/15/2021 at 9:15 AM  To page go to [email or web address]   [facility] -  Office  [phone]  See all Orders from today for further details    Objective:   Vitals:   08/14/21 2027 08/15/21 0008 08/15/21 0409 08/15/21 0752  BP:  137/72 (!) 161/79 (!) 149/67  Pulse: 61 (!) 55 (!) 57 64  Resp:  17 19 19   Temp:  97.8 F (36.6 C) 97.9 F (36.6 C)  98.6 F (37 C)  TempSrc:  Oral Oral Oral  SpO2:  98% 99% 98%  Weight:   55.9 kg   Height:        Wt Readings from Last 3 Encounters:  08/15/21 55.9 kg  08/13/21 56.7 kg  07/18/21 58.3 kg     Intake/Output Summary (Last 24 hours) at 08/15/2021 0915 Last data filed at 08/15/2021 0413 Gross per 24 hour  Intake 1001.65 ml  Output 700 ml  Net 301.65 ml     Physical Exam  Awake Alert, No new F.N deficits, Normal affect [place].AT,PERRAL Supple Neck, No JVD,   Symmetrical Chest wall movement, Good air movement bilaterally, CTAB RRR,No Gallops, Rubs or new Murmurs,   +ve B.Sounds, Abd Soft, No tenderness,   No Cyanosis, Clubbing or edema       Data Review:    CBC Recent Labs  Lab 08/13/21 0344 08/13/21 0345 08/14/21 0429 08/15/21 0346  WBC 3.3* 3.6* 3.5* 7.2  HGB 14.9 15.4* 15.0 13.6  HCT 42.8 43.5 43.0 38.8  PLT 171 167 155 156  MCV 87.3 87.2 90.1 88.4  MCH 30.4 30.9 31.4 31.0  MCHC 34.8 35.4 34.9 35.1  RDW 11.9 12.0 12.3 12.7  LYMPHSABS  --   --  0.7 1.1  MONOABS  --   --  0.1 0.9  EOSABS  --   --  0.0 0.0  BASOSABS  --   --  0.0 0.0    Electrolytes Recent Labs  Lab 08/13/21 0344 08/13/21 0345 08/13/21 1230 08/14/21 0429 08/14/21 1053 08/15/21 0346  NA 121* 120*  --  130* 133* 130*  K 2.8* 2.6*  --  4.1 3.2* 4.4  CL 82* 83*  --  98 98 102  CO2 27 26  --  25 25 21*  GLUCOSE 135* 122*  --  143* 167* 122*  BUN 6* 7*  --  9 8 13   CREATININE 0.79 0.78  --  0.83 0.86 0.70  CALCIUM 9.0 8.8*  --  8.2* 8.4* 8.4*  AST 35 36  --  42*  --  37  ALT 19 19  --  20  --  19  ALKPHOS 55 51  --  44  --  38  BILITOT 1.0 0.8  --  0.7  --  0.4  ALBUMIN 4.0 4.3  --  3.4*  --  3.4*  MG 1.5*  --   --  2.0  --  2.0  CRP  --   --  <0.5 <0.5  --  <0.5  DDIMER  --   --  0.61* 0.65*  --  0.74*  PROCALCITON  --   --  <0.10  --   --   --   TSH  --   --  4.978*  --   --   --     ------------------------------------------------------------------------------------------------------------------ No results for input(s): CHOL, HDL, LDLCALC, TRIG, CHOLHDL, LDLDIRECT in the last 72 hours.  No results found for: HGBA1C  Recent Labs    08/13/21 1230  TSH 4.978*   ------------------------------------------------------------------------------------------------------------------ ID Labs Recent Labs  Lab 08/13/21 0344 08/13/21 0345 08/13/21 1230 08/14/21 0429 08/14/21 1053 08/15/21 0346  WBC 3.3* 3.6*  --  3.5*  --  7.2  PLT 171 167  --  155  --  156  CRP  --   --  <0.5 <0.5  --  <0.5  DDIMER  --   --  0.61* 0.65*  --  0.74*  PROCALCITON   --   --  <  0.10  --   --   --   CREATININE 0.79 0.78  --  0.83 0.86 0.70   Cardiac Enzymes No results for input(s): CKMB, TROPONINI, MYOGLOBIN in the last 168 hours.  Invalid input(s): CK   Radiology Reports DG Chest Port 1 View  Result Date: 08/14/2021 CLINICAL DATA:  Shortness of breath over the last 2 weeks. Coronavirus infection. EXAM: PORTABLE CHEST 1 VIEW COMPARISON:  None. FINDINGS: Mild cardiomegaly. Aortic atherosclerotic calcification. Numerous calcified granulomas in the lower lungs, more numerous on the right than the left. No sign infiltrate, collapse or effusion. No focal bone finding. IMPRESSION: No active cardiopulmonary disease. Mild cardiomegaly.  Aortic atherosclerosis. Numerous calcified granulomas in the lower lungs, more numerous on the right than the left Electronically Signed   By: Nelson Chimes M.D.   On: 08/14/2021 08:11   ECHOCARDIOGRAM LIMITED  Result Date: 08/13/2021    ECHOCARDIOGRAM LIMITED REPORT   Patient Name:   SURINA STORTS Date of Exam: 08/13/2021 Medical Rec #:  [id number]         Height:       65.0 in Accession #:    [id number]        Weight:       125.0 lb Date of Birth:  07/03/1932          BSA:          1.620 m Patient Age:    4 years          BP:           121/70 mmHg Patient Gender: F                 HR:           71 bpm. Exam Location:  Inpatient Procedure: Limited Echo, Cardiac Doppler and Color Doppler Indications:    I48.91* Unspeicified atrial fibrillation  History:        Patient has no prior history of Echocardiogram examinations.                 Abnormal ECG, Arrythmias:Atrial Fibrillation; Risk                 Factors:Hypertension and Dyslipidemia. Covid positive.  Sonographer:    Roseanna Rainbow RDCS Referring Phys: [id number] JENNIFER YATES  Sonographer Comments: Technically difficult study due to poor echo windows. IMPRESSIONS  1. Left ventricular ejection fraction, by estimation, is 65 to 70%. Left ventricular ejection fraction by 3D volume is 66 %. The  left ventricle has normal function. The left ventricle has no regional wall motion abnormalities. Left ventricular diastolic  function could not be evaluated.  2. Right ventricular systolic function is normal. The right ventricular size is normal. There is normal pulmonary artery systolic pressure.  3. Trivial mitral valve regurgitation.  4. The aortic valve is normal in structure. Aortic valve regurgitation is mild. No aortic stenosis is present. FINDINGS  Left Ventricle: Left ventricular ejection fraction, by estimation, is 65 to 70%. Left ventricular ejection fraction by 3D volume is 66 %. The left ventricle has normal function. The left ventricle has no regional wall motion abnormalities. The left ventricular internal cavity size was normal in size. There is no left ventricular hypertrophy. Left ventricular diastolic function could not be evaluated. Right Ventricle: The right ventricular size is normal. Right vetricular wall thickness was not well visualized. Right ventricular systolic function is normal. There is normal pulmonary artery systolic pressure. The tricuspid regurgitant velocity is 2.27 m/s, and with  an assumed right atrial pressure of 3 mmHg, the estimated right ventricular systolic pressure is 16.1 mmHg. Left Atrium: Left atrial size was normal in size. Right Atrium: Right atrial size was normal in size. Pericardium: There is no evidence of pericardial effusion. Mitral Valve: Trivial mitral valve regurgitation. Tricuspid Valve: The tricuspid valve is not well visualized. Tricuspid valve regurgitation is mild. Aortic Valve: The aortic valve is normal in structure. Aortic valve regurgitation is mild. Aortic regurgitation PHT measures 708 msec. No aortic stenosis is present. Aorta: The aortic root and ascending aorta are structurally normal, with no evidence of dilitation. LEFT VENTRICLE PLAX 2D LVIDd:         4.00 cm LVIDs:         2.40 cm LV PW:         0.90 cm         3D Volume EF LV IVS:         0.80 cm         LV 3D EF:    Left LVOT diam:     2.00 cm                      ventricul LVOT Area:     3.14 cm                     ar                                             ejection                                             fraction                                             by 3D                                             volume is                                             66 %.                                 3D Volume EF:                                3D EF:        66 %                                LV EDV:       81 ml  LV ESV:       28 ml                                LV SV:        54 ml IVC IVC diam: 1.60 cm LEFT ATRIUM         Index LA diam:    2.10 cm 1.30 cm/m  AORTIC VALVE AI PHT:      708 msec  AORTA Ao Root diam: 3.40 cm Ao Asc diam:  3.60 cm MITRAL VALVE               TRICUSPID VALVE MV Area (PHT): 4.31 cm    TR Peak grad:   20.6 mmHg MV Decel Time: 176 msec    TR Vmax:        227.00 cm/s MV E velocity: 75.30 cm/s MV A velocity: 64.10 cm/s  SHUNTS MV E/A ratio:  1.17        Systemic Diam: 2.00 cm Mertie Moores MD Electronically signed by Mertie Moores MD Signature Date/Time: 08/13/2021/4:04:16 PM    Final

## 2021-08-15 NOTE — Progress Notes (Signed)
Mobility Specialist Progress Note:   08/15/21 1500  Mobility  Activity Ambulated to bathroom;Ambulated in room  Level of Assistance Independent  Assistive Device None  Distance Ambulated (ft) 30 ft  Mobility Out of bed for toileting;Ambulated with assistance in room  Mobility Response Tolerated well  Mobility performed by Mobility specialist  $Mobility charge 1 Mobility   No complaints of pain. Pt left EOB with call bell in reach and all needs met.   [facility] Public librarian Phone [phone] Secondary Phone [phone]

## 2021-08-15 NOTE — Evaluation (Signed)
Physical Therapy Evaluation Patient Details Name: Kayla Bass MRN: [id number] DOB: Aug 24, 1932 Today's Date: 08/15/2021  History of Present Illness  Pt is 86 y/o F presenting with dehydration and hyponatremia. Found to have have COVID-19 and H. pylori. PMH includes HTN; HLD; ?afib.   Clinical Impression  Patient received in bed, she reports she is feeling better. Was able to eat some of her breakfast. She is independent with bed mobility, transfers and ambulation in room without AD. Patient is weaker than her baseline due to not eating, but does not require continued skilled PT at this time. Patient will be able to return home at discharge without PT follow up and assistance from son as needed. PT to sign off.          Recommendations for follow up therapy are one component of a multi-disciplinary discharge planning process, led by the attending physician.  Recommendations may be updated based on patient status, additional functional criteria and insurance authorization.  Follow Up Recommendations No PT follow up    Assistance Recommended at Discharge Intermittent Supervision/Assistance  Functional Status Assessment Patient has had a recent decline in their functional status and demonstrates the ability to make significant improvements in function in a reasonable and predictable amount of time.  Equipment Recommendations  None recommended by PT    Recommendations for Other Services       Precautions / Restrictions Precautions Precaution Comments: low fall      Mobility  Bed Mobility Overal bed mobility: Independent                  Transfers Overall transfer level: Independent Equipment used: None Transfers: Sit to/from Stand Sit to Stand: Independent                Ambulation/Gait Ambulation/Gait assistance: Independent Gait Distance (Feet): 25 Feet Assistive device: None Gait Pattern/deviations: WFL(Within Functional Limits) Gait velocity: decreased      General Gait Details: She has been getting up to bathroom independently in room.  Stairs            Wheelchair Mobility    Modified Rankin (Stroke Patients Only)       Balance Overall balance assessment: Mild deficits observed, not formally tested;Independent                                           Pertinent Vitals/Pain Pain Assessment: No/denies pain    Home Living Family/patient expects to be discharged to:: Private residence Living Arrangements: Alone Available Help at Discharge: Family;Available PRN/intermittently;Available 24 hours/day Type of Home: Apartment Home Access: Stairs to enter Entrance Stairs-Rails: Right Entrance Stairs-Number of Steps: 2   Home Layout: One level Home Equipment: Shower seat - built in;Grab bars - tub/shower      Prior Function Prior Level of Function : Independent/Modified Independent;Driving             Mobility Comments: independent ADLs Comments: independent     Hand Dominance   Dominant Hand: Right    Extremity/Trunk Assessment   Upper Extremity Assessment Upper Extremity Assessment: Defer to OT evaluation    Lower Extremity Assessment Lower Extremity Assessment: Generalized weakness    Cervical / Trunk Assessment Cervical / Trunk Assessment: Normal  Communication   Communication: HOH  Cognition Arousal/Alertness: Awake/alert Behavior During Therapy: WFL for tasks assessed/performed Overall Cognitive Status: Within Functional Limits for tasks assessed  General Comments      Exercises     Assessment/Plan    PT Assessment Patient does not need any further PT services  PT Problem List         PT Treatment Interventions      PT Goals (Current goals can be found in the Care Plan section)  Acute Rehab PT Goals Patient Stated Goal: to return home PT Goal Formulation: With patient Time For Goal Achievement:  08/22/21 Potential to Achieve Goals: Good    Frequency     Barriers to discharge        Co-evaluation               AM-PAC PT "6 Clicks" Mobility  Outcome Measure Help needed turning from your back to your side while in a flat bed without using bedrails?: None Help needed moving from lying on your back to sitting on the side of a flat bed without using bedrails?: None Help needed moving to and from a bed to a chair (including a wheelchair)?: None Help needed standing up from a chair using your arms (e.g., wheelchair or bedside chair)?: None Help needed to walk in hospital room?: None Help needed climbing 3-5 steps with a railing? : None 6 Click Score: 24    End of Session   Activity Tolerance: Patient tolerated treatment well Patient left: in bed Nurse Communication: Mobility status PT Visit Diagnosis: Muscle weakness (generalized) (M62.81)    Time: 4098-1191 PT Time Calculation (min) (ACUTE ONLY): 8 min   Charges:   PT Evaluation $PT Eval Low Complexity: 1 Low          Mako Pelfrey, PT, GCS 08/15/21,11:45 AM

## 2021-08-15 NOTE — TOC Benefit Eligibility Note (Signed)
Patient Teacher, English as a foreign language completed.    The patient is currently admitted and upon discharge could be taking Eliquis 2.5 mg.  The current 30 day co-pay is, $47.00.   The patient is insured through [place], [place] Patient Advocate Specialist [place] Patient Advocate Team Direct Number: [phone]  Fax: [phone]

## 2021-08-16 ENCOUNTER — Other Ambulatory Visit (HOSPITAL_COMMUNITY): Payer: Self-pay

## 2021-08-16 LAB — COMPREHENSIVE METABOLIC PANEL
ALT: 24 U/L (ref 0–44)
AST: 41 U/L (ref 15–41)
Albumin: 3.4 g/dL — ABNORMAL LOW (ref 3.5–5.0)
Alkaline Phosphatase: 45 U/L (ref 38–126)
Anion gap: 6 (ref 5–15)
BUN: 12 mg/dL (ref 8–23)
CO2: 24 mmol/L (ref 22–32)
Calcium: 8.2 mg/dL — ABNORMAL LOW (ref 8.9–10.3)
Chloride: 102 mmol/L (ref 98–111)
Creatinine, Ser: 0.59 mg/dL (ref 0.44–1.00)
GFR, Estimated: >60 mL/min (ref >60)
Glucose, Bld: 107 mg/dL — ABNORMAL HIGH (ref 70–99)
Potassium: 3.4 mmol/L — ABNORMAL LOW (ref 3.5–5.1)
Sodium: 132 mmol/L — ABNORMAL LOW (ref 135–145)
Total Bilirubin: 0.6 mg/dL (ref 0.3–1.2)
Total Protein: 5.8 g/dL — ABNORMAL LOW (ref 6.5–8.1)

## 2021-08-16 LAB — CBC WITH DIFFERENTIAL/PLATELET
Abs Immature Granulocytes: 0.03 10*3/uL (ref 0.00–0.07)
Basophils Absolute: 0 10*3/uL (ref 0.0–0.1)
Basophils Relative: 0 %
Eosinophils Absolute: 0 10*3/uL (ref 0.0–0.5)
Eosinophils Relative: 0 %
HCT: 40.9 % (ref 36.0–46.0)
Hemoglobin: 14.4 g/dL (ref 12.0–15.0)
Immature Granulocytes: 0 %
Lymphocytes Relative: 23 %
Lymphs Abs: 2 10*3/uL (ref 0.7–4.0)
MCH: 31.2 pg (ref 26.0–34.0)
MCHC: 35.2 g/dL (ref 30.0–36.0)
MCV: 88.7 fL (ref 80.0–100.0)
Monocytes Absolute: 0.8 10*3/uL (ref 0.1–1.0)
Monocytes Relative: 9 %
Neutro Abs: 5.8 10*3/uL (ref 1.7–7.7)
Neutrophils Relative %: 68 %
Platelets: 163 10*3/uL (ref 150–400)
RBC: 4.61 MIL/uL (ref 3.87–5.11)
RDW: 12.7 % (ref 11.5–15.5)
WBC: 8.7 10*3/uL (ref 4.0–10.5)
nRBC: 0 % (ref 0.0–0.2)

## 2021-08-16 LAB — C-REACTIVE PROTEIN: CRP: 0.6 mg/dL (ref <1.0)

## 2021-08-16 LAB — D-DIMER, QUANTITATIVE: D-Dimer, Quant: 0.54 ug/mL-FEU — ABNORMAL HIGH (ref 0.00–0.50)

## 2021-08-16 LAB — MAGNESIUM: Magnesium: 1.8 mg/dL (ref 1.7–2.4)

## 2021-08-16 LAB — PHOSPHORUS: Phosphorus: 2.7 mg/dL (ref 2.5–4.6)

## 2021-08-16 MED ORDER — CARVEDILOL 3.125 MG PO TABS
3.1250 mg | ORAL_TABLET | Freq: Two times a day (BID) | ORAL | 0 refills | Status: DC
  Filled 2021-08-16: qty 60, 30d supply, fill #0

## 2021-08-16 MED ORDER — APIXABAN 2.5 MG PO TABS
2.5000 mg | ORAL_TABLET | Freq: Two times a day (BID) | ORAL | 0 refills | Status: DC
  Filled 2021-08-16: qty 60, 30d supply, fill #0

## 2021-08-16 MED ORDER — ISOSORBIDE MONONITRATE ER 30 MG PO TB24
30.0000 mg | ORAL_TABLET | Freq: Every day | ORAL | Status: DC
  Administered 2021-08-16: 11:00:00 30 mg via ORAL
  Filled 2021-08-16: qty 1

## 2021-08-16 MED ORDER — BISMUTH SUBSALICYLATE 262 MG PO CHEW: 524.0000 mg | CHEWABLE_TABLET | Freq: Three times a day (TID) | ORAL | 0 refills | Status: AC | PRN

## 2021-08-16 MED ORDER — CARVEDILOL 6.25 MG PO TABS: 6.2500 mg | ORAL_TABLET | Freq: Two times a day (BID) | ORAL | Status: DC

## 2021-08-16 MED ORDER — ONDANSETRON HCL 4 MG PO TABS
4.0000 mg | ORAL_TABLET | Freq: Three times a day (TID) | ORAL | 0 refills | Status: DC | PRN
  Filled 2021-08-16: qty 20, 7d supply, fill #0

## 2021-08-16 MED ORDER — POTASSIUM CHLORIDE CRYS ER 20 MEQ PO TBCR
40.0000 meq | EXTENDED_RELEASE_TABLET | Freq: Once | ORAL | Status: AC
  Administered 2021-08-16: 08:00:00 40 meq via ORAL
  Filled 2021-08-16: qty 2

## 2021-08-16 MED ORDER — LISINOPRIL 10 MG PO TABS
10.0000 mg | ORAL_TABLET | Freq: Every day | ORAL | Status: DC
  Administered 2021-08-16: 08:00:00 10 mg via ORAL
  Filled 2021-08-16: qty 1

## 2021-08-16 MED ORDER — AMLODIPINE BESYLATE 10 MG PO TABS
10.0000 mg | ORAL_TABLET | Freq: Every day | ORAL | 0 refills | Status: DC
  Filled 2021-08-16: qty 30, 30d supply, fill #0

## 2021-08-16 NOTE — Discharge Summary (Signed)
Kayla Bass WPY:[id number] DOB: [birth year] DOA: 08/13/2021  PCP: Kathyrn Lass, MD  Admit date: 08/13/2021  Discharge date: 08/16/2021  Admitted From: Home   Disposition: Home   Recommendations for Outpatient Follow-up:   Follow up with PCP in 1-2 weeks  PCP Please obtain BMP/CBC, 2 view CXR in 1week,  (see Discharge instructions)   PCP Please follow up on the following pending results: Needs close outpatient GI and cardiology follow-up.   Home Health: None   Equipment/Devices: None  Consultations: [facility] Dr. Rush Landmark over the phone Discharge Condition: Stable    CODE STATUS: Full    Diet Recommendation: Heart Healthy   Diet Order             Diet - low sodium heart healthy           DIET SOFT Room service appropriate? Yes; Fluid consistency: Thin  Diet effective now                    Chief Complaint  Patient presents with   Abdominal Pain     Brief history of present illness from the day of admission and additional interim summary    Kayla Bass is a 87 y.o. female with medical history significant of HTN; HLD; ?afib; she has been recently suffering from severe gastritis and was diagnosed with H. pylori antigen in her stool by her gastroenterologist few days ago.  She was subsequently placed on H. pylori treatment, within 2 days of treatment she developed severe nausea vomiting and diarrhea and came to the hospital where she had severe dehydration with electrolyte abnormalities severe hyponatremia and she was admitted, she also had incidental COVID infection.                                                                 Hospital Course     Severe diarrhea causing severe dehydration, hyponatremia hypokalemia and hypophosphatemia - her symptoms were most likely caused by H.  pylori treatment medications .  She has been hydrated with IV fluids, electrolytes replaced, symptoms much improved Case discussed with GI on-call, with told H. pylori treatment with outpatient GI follow-up.  Currently symptom-free will be discharged home with close PCP and outpatient GI follow-up   2.  H. pylori antigen positive stool.  [facility], plan is to withhold H. pylori medications with 2-week outpatient follow-up with Dr. Lorenso Courier and then reconvene the plan.   3.  Newly diagnosed paroxysmal A. fib with RVR.  Likely due to dehydration, [place] vas 2 score of greater than 3.  Placed on Eliquis and beta-blocker.  Echocardiogram nonacute, TSH stable.  Continue to monitor.  Outpatient cardiology follow-up to be arranged by PCP.   4.  Dyslipidemia.  On statin continue.   5.  Incidental COVID-19  infection.  Stable CRP.  No acute issue.   6.  Hypertension.  Blood pressure getting high.  On home dose ACE inhibitor, low-dose Coreg and Norvasc.  PCP to monitor and adjust.  Discharge diagnosis     Principal Problem:   Dehydration with hyponatremia Active Problems:   Essential (primary) hypertension   Mixed hyperlipidemia   New onset atrial fibrillation (HCC)   H. pylori infection   COVID-19 virus infection   Hypokalemia due to excessive gastrointestinal loss of potassium    Discharge instructions    Discharge Instructions     Amb referral to [facility]   Complete by: As directed    Diet - low sodium heart healthy   Complete by: As directed    Discharge instructions   Complete by: As directed    Follow with Primary MD Kathyrn Lass, MD in 7 days   Get CBC, CMP, 2 view Chest X ray -  checked next visit within 1 week by Primary MD    Activity: As tolerated with Full fall precautions use walker/cane & assistance as needed  Disposition Home    Diet: Heart Healthy     Special Instructions: If you have smoked or chewed Tobacco  in the last 2 yrs please stop smoking, stop any regular  Alcohol  and or any Recreational drug use.  On your next visit with your primary care physician please Get Medicines reviewed and adjusted.  Please request your Prim.MD to go over all Hospital Tests and Procedure/Radiological results at the follow up, please get all Hospital records sent to your Prim MD by signing hospital release before you go home.  If you experience worsening of your admission symptoms, develop shortness of breath, life threatening emergency, suicidal or homicidal thoughts you must seek medical attention immediately by calling 911 or calling your MD immediately  if symptoms less severe.  You Must read complete instructions/literature along with all the possible adverse reactions/side effects for all the Medicines you take and that have been prescribed to you. Take any new Medicines after you have completely understood and accpet all the possible adverse reactions/side effects.   Increase activity slowly   Complete by: As directed    MyChart COVID-19 home monitoring program   Complete by: Aug 16, 2021    Is the patient willing to use the [facility] for home monitoring?: Yes   Temperature monitoring   Complete by: Aug 16, 2021    After how many days would you like to receive a notification of this patient's flowsheet entries?: 1       Discharge Medications   Allergies as of 08/16/2021       Reactions   Alendronate    Stomach upset Other reaction(s): stomach upset   Benicar [olmesartan]    Stomach upset   Other    Unsure of name - antibiotic for an UTI, caused mental confusion and cloudiness        Medication List     STOP taking these medications    metroNIDAZOLE 250 MG tablet Commonly known as: FLAGYL   omeprazole 20 MG capsule Commonly known as: PRILOSEC   tetracycline 500 MG capsule Commonly known as: SUMYCIN       TAKE these medications    amLODipine 10 MG tablet Commonly known as: NORVASC Take 1 tablet (10 mg total) by mouth  daily.   apixaban 2.5 MG Tabs tablet Commonly known as: ELIQUIS Take 1 tablet (2.5 mg total) by mouth 2 (two) times  daily.   Biotin 5 MG Caps Take 5 mg by mouth daily.   bismuth subsalicylate 235 MG chewable tablet Commonly known as: Pepto-Bismol Chew 2 tablets (524 mg total) by mouth 3 (three) times daily as needed for up to 14 days for indigestion. What changed:  when to take this reasons to take this   CALCIUM PO Take by mouth daily. As directed   carvedilol 3.125 MG tablet Commonly known as: Coreg Take 1 tablet (3.125 mg total) by mouth 2 (two) times daily.   hydrochlorothiazide 25 MG tablet Commonly known as: HYDRODIURIL Take 12.5 mg by mouth daily. Takes 1/2 daily   lisinopril 10 MG tablet Commonly known as: ZESTRIL Take 10 mg by mouth daily.   lovastatin 40 MG tablet Commonly known as: MEVACOR Take 40 mg by mouth at bedtime.   Magnesium 400 MG Caps Take 400 mg by mouth daily.   ondansetron 4 MG tablet Commonly known as: Zofran Take 1 tablet (4 mg total) by mouth every 8 (eight) hours as needed for nausea or vomiting.   pantoprazole 40 MG tablet Commonly known as: PROTONIX Take 1 tablet (40 mg total) by mouth 2 (two) times daily before a meal. Take 30 mins. before meals   PreserVision AREDS 2+Multi Vit Caps Take 1 tablet by mouth daily.   VITAMIN D-3 PO Take 2,000 Units by mouth daily.         Follow-up Information     Kathyrn Lass, MD. Schedule an appointment as soon as possible for a visit in 1 week(s).   Specialty: Family Medicine Contact information: [place] [address] [phone]         Sharyn Creamer, MD. Schedule an appointment as soon as possible for a visit in 1 week(s).   Specialty: Gastroenterology Contact information: [address] [place] [address] [phone]                 Major procedures and Radiology Reports - PLEASE review detailed and final reports thoroughly  -        CT ABDOMEN PELVIS W CONTRAST  Result Date: 08/03/2021 CLINICAL DATA:  Epigastric pain and left lower quadrant pain for 6 weeks. Bloating and 10 lb weight loss over last 6 weeks. EXAM: CT ABDOMEN AND PELVIS WITH CONTRAST TECHNIQUE: Multidetector CT imaging of the abdomen and pelvis was performed using the standard protocol following bolus administration of intravenous contrast. CONTRAST:  46mL OMNIPAQUE IOHEXOL 350 MG/ML SOLN COMPARISON:  None. FINDINGS: Lower Chest: No acute findings. Hepatobiliary: No hepatic masses identified. Small cyst noted in the left hepatic lobe. Gallbladder is unremarkable. No evidence of biliary ductal dilatation. Pancreas:  No mass or inflammatory changes. Spleen: Within normal limits in size and appearance. Adrenals/Urinary Tract: No masses identified. No evidence of ureteral calculi or hydronephrosis. Stomach/Bowel: No evidence of obstruction, inflammatory process or abnormal fluid collections. Vascular/Lymphatic: No pathologically enlarged lymph nodes. No acute vascular findings. Aortic atherosclerotic calcification noted. Reproductive:  No mass or other significant abnormality. Other:  None. Musculoskeletal:  No suspicious bone lesions identified. IMPRESSION: No acute findings or other significant abnormality. Aortic Atherosclerosis (ICD10-I70.0). Electronically Signed   By: Marlaine Hind M.D.   On: 08/03/2021 10:54   DG Chest Port 1 View  Result Date: 08/14/2021 CLINICAL DATA:  Shortness of breath over the last 2 weeks. Coronavirus infection. EXAM: PORTABLE CHEST 1 VIEW COMPARISON:  None. FINDINGS: Mild cardiomegaly. Aortic atherosclerotic calcification. Numerous calcified granulomas in the lower lungs, more numerous  on the right than the left. No sign infiltrate, collapse or effusion. No focal bone finding. IMPRESSION: No active cardiopulmonary disease. Mild cardiomegaly.  Aortic atherosclerosis. Numerous calcified granulomas in the lower lungs, more numerous on the right  than the left Electronically Signed   By: Nelson Chimes M.D.   On: 08/14/2021 08:11   ECHOCARDIOGRAM LIMITED  Result Date: 08/13/2021    ECHOCARDIOGRAM LIMITED REPORT   Patient Name:   Kayla Bass Date of Exam: 08/13/2021 Medical Rec #:  [id number]         Height:       65.0 in Accession #:    [id number]        Weight:       125.0 lb Date of Birth:  06/21/1932          BSA:          1.620 m Patient Age:    71 years          BP:           121/70 mmHg Patient Gender: F                 HR:           71 bpm. Exam Location:  Inpatient Procedure: Limited Echo, Cardiac Doppler and Color Doppler Indications:    I48.91* Unspeicified atrial fibrillation  History:        Patient has no prior history of Echocardiogram examinations.                 Abnormal ECG, Arrythmias:Atrial Fibrillation; Risk                 Factors:Hypertension and Dyslipidemia. Covid positive.  Sonographer:    Roseanna Rainbow RDCS Referring Phys: [id number] JENNIFER YATES  Sonographer Comments: Technically difficult study due to poor echo windows. IMPRESSIONS  1. Left ventricular ejection fraction, by estimation, is 65 to 70%. Left ventricular ejection fraction by 3D volume is 66 %. The left ventricle has normal function. The left ventricle has no regional wall motion abnormalities. Left ventricular diastolic  function could not be evaluated.  2. Right ventricular systolic function is normal. The right ventricular size is normal. There is normal pulmonary artery systolic pressure.  3. Trivial mitral valve regurgitation.  4. The aortic valve is normal in structure. Aortic valve regurgitation is mild. No aortic stenosis is present. FINDINGS  Left Ventricle: Left ventricular ejection fraction, by estimation, is 65 to 70%. Left ventricular ejection fraction by 3D volume is 66 %. The left ventricle has normal function. The left ventricle has no regional wall motion abnormalities. The left ventricular internal cavity size was normal in size. There is no left  ventricular hypertrophy. Left ventricular diastolic function could not be evaluated. Right Ventricle: The right ventricular size is normal. Right vetricular wall thickness was not well visualized. Right ventricular systolic function is normal. There is normal pulmonary artery systolic pressure. The tricuspid regurgitant velocity is 2.27 m/s, and with an assumed right atrial pressure of 3 mmHg, the estimated right ventricular systolic pressure is 24.5 mmHg. Left Atrium: Left atrial size was normal in size. Right Atrium: Right atrial size was normal in size. Pericardium: There is no evidence of pericardial effusion. Mitral Valve: Trivial mitral valve regurgitation. Tricuspid Valve: The tricuspid valve is not well visualized. Tricuspid valve regurgitation is mild. Aortic Valve: The aortic valve is normal in structure. Aortic valve regurgitation is mild. Aortic regurgitation PHT measures 708 msec. No aortic stenosis is present.  Aorta: The aortic root and ascending aorta are structurally normal, with no evidence of dilitation. LEFT VENTRICLE PLAX 2D LVIDd:         4.00 cm LVIDs:         2.40 cm LV PW:         0.90 cm         3D Volume EF LV IVS:        0.80 cm         LV 3D EF:    Left LVOT diam:     2.00 cm                      ventricul LVOT Area:     3.14 cm                     ar                                             ejection                                             fraction                                             by 3D                                             volume is                                             66 %.                                 3D Volume EF:                                3D EF:        66 %                                LV EDV:       81 ml                                LV ESV:       28 ml                                LV SV:        54 ml IVC IVC diam: 1.60 cm LEFT ATRIUM         Index LA diam:    2.10  cm 1.30 cm/m  AORTIC VALVE AI PHT:      708 msec  AORTA Ao Root diam:  3.40 cm Ao Asc diam:  3.60 cm MITRAL VALVE               TRICUSPID VALVE MV Area (PHT): 4.31 cm    TR Peak grad:   20.6 mmHg MV Decel Time: 176 msec    TR Vmax:        227.00 cm/s MV E velocity: 75.30 cm/s MV A velocity: 64.10 cm/s  SHUNTS MV E/A ratio:  1.17        Systemic Diam: 2.00 cm Mertie Moores MD Electronically signed by Mertie Moores MD Signature Date/Time: 08/13/2021/4:04:16 PM    Final      Today   Subjective    Kayla Bass today has no headache,no chest abdominal pain,no new weakness tingling or numbness, feels much better wants to go home today.     Objective   Blood pressure (!) 167/88, pulse (!) 58, temperature 97.9 F (36.6 C), temperature source Oral, resp. rate 20, height 5\' 7"  (1.702 m), weight 56.1 kg, SpO2 99 %.   Intake/Output Summary (Last 24 hours) at 08/16/2021 0914 Last data filed at 08/16/2021 0553 Gross per 24 hour  Intake 2084.33 ml  Output 1500 ml  Net 584.33 ml    Exam  Awake Alert, No new F.N deficits, Normal affect [place].AT,PERRAL Supple Neck,No JVD, No cervical lymphadenopathy appriciated.  Symmetrical Chest wall movement, Good air movement bilaterally, CTAB RRR,No Gallops,Rubs or new Murmurs, No Parasternal Heave +ve B.Sounds, Abd Soft, Non tender, No organomegaly appriciated, No rebound -guarding or rigidity. No Cyanosis, Clubbing or edema, No new Rash or bruise   Data Review   CBC w Diff:  Lab Results  Component Value Date   WBC 8.7 08/16/2021   HGB 14.4 08/16/2021   HCT 40.9 08/16/2021   PLT 163 08/16/2021   LYMPHOPCT 23 08/16/2021   MONOPCT 9 08/16/2021   EOSPCT 0 08/16/2021   BASOPCT 0 08/16/2021    CMP:  Lab Results  Component Value Date   NA 132 (L) 08/16/2021   K 3.4 (L) 08/16/2021   CL 102 08/16/2021   CO2 24 08/16/2021   BUN 12 08/16/2021   CREATININE 0.59 08/16/2021   PROT 5.8 (L) 08/16/2021   ALBUMIN 3.4 (L) 08/16/2021   BILITOT 0.6 08/16/2021   ALKPHOS 45 08/16/2021   AST 41 08/16/2021   ALT 24  08/16/2021  .  Lab Results  Component Value Date   TSH 4.978 (H) 08/13/2021   Recent Labs  Lab 08/13/21 0344 08/13/21 0345 08/13/21 1230 08/14/21 0429 08/14/21 1053 08/15/21 0346 08/16/21 0456  WBC 3.3* 3.6*  --  3.5*  --  7.2 8.7  PLT 171 167  --  155  --  156 163  CRP  --   --  <0.5 <0.5  --  <0.5 0.6  DDIMER  --   --  0.61* 0.65*  --  0.74* 0.54*  PROCALCITON  --   --  <0.10  --   --   --   --   CREATININE 0.79 0.78  --  0.83 0.86 0.70 0.59       Total Time in preparing paper work, data evaluation and todays exam - 35 minutes  Lala Lund M.D on 08/16/2021 at 9:14 AM  [facility]

## 2021-08-17 ENCOUNTER — Ambulatory Visit: Payer: Medicare Other | Admitting: Internal Medicine

## 2021-08-24 DIAGNOSIS — E860 Dehydration: Secondary | ICD-10-CM | POA: Diagnosis not present

## 2021-08-24 DIAGNOSIS — D6869 Other thrombophilia: Secondary | ICD-10-CM | POA: Diagnosis not present

## 2021-08-24 DIAGNOSIS — I700 Atherosclerosis of aorta: Secondary | ICD-10-CM | POA: Diagnosis not present

## 2021-08-24 DIAGNOSIS — Z09 Encounter for follow-up examination after completed treatment for conditions other than malignant neoplasm: Secondary | ICD-10-CM | POA: Diagnosis not present

## 2021-08-24 DIAGNOSIS — I4891 Unspecified atrial fibrillation: Secondary | ICD-10-CM | POA: Diagnosis not present

## 2021-08-24 DIAGNOSIS — E871 Hypo-osmolality and hyponatremia: Secondary | ICD-10-CM | POA: Diagnosis not present

## 2021-08-24 DIAGNOSIS — J8410 Pulmonary fibrosis, unspecified: Secondary | ICD-10-CM | POA: Diagnosis not present

## 2021-08-29 ENCOUNTER — Other Ambulatory Visit (HOSPITAL_COMMUNITY): Payer: Self-pay

## 2021-08-29 ENCOUNTER — Telehealth (HOSPITAL_COMMUNITY): Payer: Self-pay | Admitting: Pharmacist

## 2021-08-29 NOTE — Telephone Encounter (Signed)
Pharmacy Transitions of Care Follow-up Telephone Call  Date of discharge: 08/16/21  Discharge Diagnosis: a.fib   Medication changes made at discharge:  - START:  amLODipine (NORVASC)  carvedilol (Coreg)  Eliquis (apixaban)  ondansetron (Zofran)   - STOPPED:  Metronidazole Omeprazole tetracycline  - CHANGED: pepto bismol  Medication Accessibility:  Home Pharmacy: [facility]   Was the patient provided with refills on discharged medications? no   Have all prescriptions been transferred from [facility] to home pharmacy? no   Is the patient able to afford medications? AARP part D Notable copays: eliquis $47 next fill Eligible patient assistance:     Medication Review:   APIXABAN (ELIQUIS)  Apixaban 2.5 mg BID initiated on 08/16/21.  - Discussed importance of taking medication around the same time everyday  - Reviewed potential DDIs with patient  - Advised patient of medications to avoid (NSAIDs, ASA)  - Educated that Tylenol (acetaminophen) will be the preferred analgesic to prevent risk of bleeding  - Emphasized importance of monitoring for signs and symptoms of bleeding (abnormal bruising, prolonged bleeding, nose bleeds, bleeding from gums, discolored urine, black tarry stools)  - Advised patient to alert all providers of anticoagulation therapy prior to starting a new medication or having a procedure     Follow-up Appointments:  PCP Hospital f/u appt confirmed? None at this time   [facility] f/u appt confirmed?  Scheduled to see Dr. Irish Lack, cardiology on 09/08/21 @ 10:40 Scheduled to see Dr. Lorenso Courier, gastro on 09/13/21 at 11:10 am  If their condition worsens, is the pt aware to call PCP or go to the Emergency Dept.? yes  Final Patient Assessment:  Pt is doing well and has good understanding of her medications and is taking them each as prescribed.

## 2021-08-30 ENCOUNTER — Other Ambulatory Visit: Payer: Self-pay | Admitting: Family Medicine

## 2021-08-30 ENCOUNTER — Other Ambulatory Visit: Payer: Self-pay

## 2021-08-30 ENCOUNTER — Ambulatory Visit
Admission: RE | Admit: 2021-08-30 | Discharge: 2021-08-30 | Disposition: A | Discharge status: Home / Self Care | Payer: Medicare Other | Source: Ambulatory Visit | Attending: Family Medicine | Admitting: Family Medicine

## 2021-08-30 DIAGNOSIS — J8410 Pulmonary fibrosis, unspecified: Secondary | ICD-10-CM | POA: Diagnosis not present

## 2021-09-08 ENCOUNTER — Encounter: Payer: Self-pay | Admitting: Interventional Cardiology

## 2021-09-08 ENCOUNTER — Other Ambulatory Visit: Payer: Self-pay

## 2021-09-08 ENCOUNTER — Ambulatory Visit: Payer: Medicare Other | Admitting: Interventional Cardiology

## 2021-09-08 VITALS — BP 112/62 | HR 72 | Ht 66.0 in | Wt 125.0 lb

## 2021-09-08 DIAGNOSIS — D692 Other nonthrombocytopenic purpura: Secondary | ICD-10-CM | POA: Diagnosis not present

## 2021-09-08 DIAGNOSIS — I4891 Unspecified atrial fibrillation: Secondary | ICD-10-CM | POA: Diagnosis not present

## 2021-09-08 DIAGNOSIS — I10 Essential (primary) hypertension: Secondary | ICD-10-CM

## 2021-09-08 DIAGNOSIS — D6869 Other thrombophilia: Secondary | ICD-10-CM | POA: Diagnosis not present

## 2021-09-08 MED ORDER — APIXABAN 2.5 MG PO TABS: 2.5000 mg | ORAL_TABLET | Freq: Two times a day (BID) | ORAL | 3 refills | Status: DC

## 2021-09-08 MED ORDER — CARVEDILOL 3.125 MG PO TABS: 3.1250 mg | ORAL_TABLET | Freq: Two times a day (BID) | ORAL | 3 refills | Status: DC

## 2021-09-08 MED ORDER — AMLODIPINE BESYLATE 10 MG PO TABS: 10.0000 mg | ORAL_TABLET | Freq: Every day | ORAL | 3 refills | Status: DC

## 2021-09-08 NOTE — Patient Instructions (Signed)
Medication Instructions:  Your physician recommends that you continue on your current medications as directed. Please refer to the Current Medication list given to you today.  *If you need a refill on your cardiac medications before your next appointment, please call your pharmacy*   Lab Work: none If you have labs (blood work) drawn today and your tests are completely normal, you will receive your results only by: [place] (if you have MyChart) OR A paper copy in the mail If you have any lab test that is abnormal or we need to change your treatment, we will call you to review the results.   Testing/Procedures: none   Follow-Up: At [facility], you and your health needs are our priority.  As part of our continuing mission to provide you with exceptional heart care, we have created designated Provider Care Teams.  These Care Teams include your primary Cardiologist (physician) and Advanced Practice Providers (APPs -  Physician Assistants and Nurse Practitioners) who all work together to provide you with the care you need, when you need it.  We recommend signing up for the patient portal called "MyChart".  Sign up information is provided on this After Visit Summary.  MyChart is used to connect with patients for Virtual Visits (Telemedicine).  Patients are able to view lab/test results, encounter notes, upcoming appointments, etc.  Non-urgent messages can be sent to your provider as well.   To learn more about what you can do with MyChart, go to [email or web address].    Your next appointment:   8 month(s)  The format for your next appointment:   In Person  Provider:   Larae Grooms, MD     Other Instructions

## 2021-09-08 NOTE — Progress Notes (Signed)
Cardiology Office Note   Date:  09/08/2021   ID:  Kayla Bass, DOB 06-Jul-1932, MRN [id number]  PCP:  Kathyrn Lass, MD    Chief Complaint  Patient presents with   Establish Care   AFib  Wt Readings from Last 3 Encounters:  09/08/21 125 lb (56.7 kg)  08/16/21 123 lb 9.6 oz (56.1 kg)  08/13/21 125 lb (56.7 kg)       History of Present Illness: Kayla Bass is a 88 y.o. female who is being seen today for the evaluation of atrial fibrillation at the request of Sharyn Creamer, MD.  She was seen for PACs in 2019.  She was admitted in December 2022 with gastritis, dehydration and COVID.  She was found to have atrial fibrillation.  She was started on Eliquis and carvedilol.  She presents for follow-up.  Echocardiogram showed: "Left ventricular ejection fraction, by estimation, is 65 to 70%. Left  ventricular ejection fraction by 3D volume is 66 %. The left ventricle has  normal function. The left ventricle has no regional wall motion  abnormalities. Left ventricular diastolic   function could not be evaluated.   2. Right ventricular systolic function is normal. The right ventricular  size is normal. There is normal pulmonary artery systolic pressure.   3. Trivial mitral valve regurgitation.   4. The aortic valve is normal in structure. Aortic valve regurgitation is  mild. No aortic stenosis is present."  No symptoms with the A. Fib.    Denies : Chest pain. Dizziness. Leg edema. Nitroglycerin use. Orthopnea. Palpitations. Paroxysmal nocturnal dyspnea.  Syncope.    Getting her strength back.  Gets tired easily.  Lost 10 lbs.  Taking Boost.   Lives with her son.  No falls.   Past Medical History:  Diagnosis Date   early melanoma of left arm    Essential (primary) hypertension    Irregular heartbeat    Microscopic hematuria    Mixed hyperlipidemia    Osteoporosis     Past Surgical History:  Procedure Laterality Date   APPENDECTOMY     MOLE REMOVAL      cancerous   TOE SURGERY Right    TUBAL LIGATION Bilateral      Current Outpatient Medications  Medication Sig Dispense Refill   amLODipine (NORVASC) 10 MG tablet Take 1 tablet (10 mg total) by mouth daily. 30 tablet 0   apixaban (ELIQUIS) 2.5 MG TABS tablet Take 1 tablet (2.5 mg total) by mouth 2 (two) times daily. 60 tablet 0   Biotin 5 MG CAPS Take 5 mg by mouth daily.     CALCIUM PO Take by mouth daily. As directed     carvedilol (COREG) 3.125 MG tablet Take 1 tablet (3.125 mg total) by mouth 2 (two) times daily. 60 tablet 0   Cholecalciferol (VITAMIN D-3 PO) Take 2,000 Units by mouth daily.     hydrochlorothiazide (HYDRODIURIL) 25 MG tablet Take 12.5 mg by mouth daily. Takes 1/2 daily     lisinopril (PRINIVIL,ZESTRIL) 10 MG tablet Take 10 mg by mouth daily.     lovastatin (MEVACOR) 40 MG tablet Take 40 mg by mouth at bedtime.     Magnesium 400 MG CAPS Take 400 mg by mouth daily.     Multiple Vitamins-Minerals (PRESERVISION AREDS 2+MULTI VIT) CAPS Take 1 tablet by mouth daily.     No current facility-administered medications for this visit.    Allergies:   Other, Alendronate, and Benicar [olmesartan]  Social History:  The patient  reports that she has never smoked. She has never used smokeless tobacco. She reports current alcohol use. She reports that she does not use drugs.   Family History:  The patient's family history includes Alzheimer's disease in her mother; CAD in her brother, brother, and brother; Healthy in her daughter, sister, sister, sister, and son; Suicidality in her father.    ROS:  Please see the history of present illness.   Otherwise, review of systems are positive for fatigue.   All other systems are reviewed and negative.    PHYSICAL EXAM: VS:  BP 112/62    Pulse 72    Ht 5\' 6"  (1.676 m)    Wt 125 lb (56.7 kg)    SpO2 98%    BMI 20.18 kg/m  , BMI Body mass index is 20.18 kg/m. GEN: Well nourished, well developed, in no acute distress HEENT:  normal Neck: no JVD, carotid bruits, or masses Cardiac: RRR; no murmurs, rubs, or gallops,; tr ankle edema bilaterally Respiratory:  clear to auscultation bilaterally, normal work of breathing GI: soft, nontender, nondistended, + BS MS: no deformity or atrophy Skin: warm and dry, no rash Neuro:  Strength and sensation are intact Psych: euthymic mood, full affect   EKG:   The ekg ordered today demonstrates NSR, PACs   Recent Labs: 08/13/2021: TSH 4.978 08/16/2021: ALT 24; BUN 12; Creatinine, Ser 0.59; Hemoglobin 14.4; Magnesium 1.8; Platelets 163; Potassium 3.4; Sodium 132   Lipid Panel No results found for: CHOL, TRIG, HDL, CHOLHDL, VLDL, LDLCALC, LDLDIRECT   Other studies Reviewed: Additional studies/ records that were reviewed today with results demonstrating: hospital records reviewed.   ASSESSMENT AND PLAN:  AFib: Back in NSR. COntinue Coreg.  No bleeding problems on low-dose Eliquis. Acquired thrombophilia: Anticoagulated with Eliquis, low-dose given age and weight. Easy bruising: Was happening before starting Eliquis, but on aspirin at that time.  No signs of internal bleeding.  Watch for any blood in stool or blood in urine. H. Pylori treatment on hold until she sees GI.  She had difficulty tolerating multiple antibiotics. HTN: The current medical regimen is effective;  continue present plan and medications.  Hyperlipidemia: has been on lovastatin for years.    Current medicines are reviewed at length with the patient today.  The patient concerns regarding her medicines were addressed.  The following changes have been made:  No change  Labs/ tests ordered today include:  No orders of the defined types were placed in this encounter.   Recommend 150 minutes/week of aerobic exercise Low fat, low carb, high fiber diet recommended  Disposition:   FU in 8-9 months   Signed, Larae Grooms, MD  09/08/2021 10:54 AM    [facility] [place], [place], [place]  [address] Phone: [phone]; Fax: [phone]

## 2021-09-11 ENCOUNTER — Telehealth: Payer: Self-pay | Admitting: Interventional Cardiology

## 2021-09-11 NOTE — Telephone Encounter (Signed)
**Note De-Identified Kayla Bass Obfuscation** The pt and I discussed the only generic anticoagulant available at this time which is Warfarin (generic for Coumadin). She states that she is not interested in taking Warfarin as it sounds like too much for her to deal with as far as the frequent office visits to check the thickness/thinness of her blood, adjustments in Warfarin dosage, and diet restrictions.  I did advise her that all other anticoagulant medications are name brand and will cost her the same amount if not more than Eliquis.  We discuss her applying for asst for Eliquis through [facility] and she is interested. I gave her [employer] phone number and advised her to call them to ask questions about their Eliquis program and her eligibility to be approved for the program. She is aware to ask them to mail her an application if it seems that she is eligible for approval.  She is also aware that when she receives the application to complete her part of it, obtain required documents per [employer], and to bring all to Dr Hassell Done office at [facility] on [address] in [place] to drop off in the front and that we will take care of the providers page of her application and will fax all to [employer].  She thanked me for calling her to discuss/explain her anticoagulant options and cost.

## 2021-09-11 NOTE — Telephone Encounter (Signed)
OF note the patient was calling about ELIQUIS NOT LYRICA.   Patient talking about the cost of eliquis. Tried to give information for patient assistance. She wished to speak with Jeani Hawking first to ask some questions and see what her options are.   Will forward to Patient assistance nurse Jeani Hawking.

## 2021-09-11 NOTE — Telephone Encounter (Signed)
° °  Pt c/o medication issue:  1. Name of Medication: lyrica  2. How are you currently taking this medication (dosage and times per day)?   3. Are you having a reaction (difficulty breathing--STAT)?   4. What is your medication issue? Pt said she tried to refill this meds since Dr. Irish Lack did advised her to continue taking it. However, the meds is expensive and wanted to ask Dr. Irish Lack if he needs to order the generic prescription. The pt said she discuss this with Dr. Hassell Done nurse last friday

## 2021-09-13 ENCOUNTER — Ambulatory Visit (INDEPENDENT_AMBULATORY_CARE_PROVIDER_SITE_OTHER): Payer: Medicare Other | Admitting: Internal Medicine

## 2021-09-13 ENCOUNTER — Other Ambulatory Visit: Payer: Medicare Other

## 2021-09-13 ENCOUNTER — Encounter: Payer: Self-pay | Admitting: Internal Medicine

## 2021-09-13 VITALS — BP 106/70 | HR 80 | Ht 65.0 in | Wt 127.4 lb

## 2021-09-13 DIAGNOSIS — A048 Other specified bacterial intestinal infections: Secondary | ICD-10-CM

## 2021-09-13 NOTE — Patient Instructions (Signed)
Your provider has requested that you go to the basement level for lab work before leaving today. Press "B" on the elevator. The lab is located at the first door on the left as you exit the elevator.   If you are age 86 or older, your body mass index should be between 23-30. Your Body mass index is 21.2 kg/m. If this is out of the aforementioned range listed, please consider follow up with your Primary Care Provider.  If you are age 89 or younger, your body mass index should be between 19-25. Your Body mass index is 21.2 kg/m. If this is out of the aformentioned range listed, please consider follow up with your Primary Care Provider.   ________________________________________________________  The [facility] providers would like to encourage you to use [facility] to communicate with providers for non-urgent requests or questions.  Due to long hold times on the telephone, sending your provider a message by [facility] may be a faster and more efficient way to get a response.  Please allow 48 business hours for a response.  Please remember that this is for non-urgent requests.  _______________________________________________________   Due to recent changes in healthcare laws, you may see the results of your imaging and laboratory studies on MyChart before your provider has had a chance to review them.  We understand that in some cases there may be results that are confusing or concerning to you. Not all laboratory results come back in the same time frame and the provider may be waiting for multiple results in order to interpret others.  Please give [place] 48 hours in order for your provider to thoroughly review all the results before contacting the office for clarification of your results.    I appreciate the  opportunity to care for you  Thank You   Sonny Masters Dorsey,MD

## 2021-09-13 NOTE — Progress Notes (Signed)
09/13/2021 Elpidio Eric [id number] 1932-05-07   CHIEF COMPLAINT: H pylori  HISTORY OF PRESENT ILLNESS:  Kayla Bass is an 88 year old female with a past medical history of hypertension, hyperlipidemia, osteoporosis, left arm melanoma s/p excision presents for H pylori  Interval History: Patient was able to tolerate a few days of H. pylori therapy before she started experiencing issues with nausea, vomiting and diarrhea.  This was severe enough to lead to an admission on 12/18 to 08/16/2021.  At that time she was found to have significant hyponatremia, hypokalemia, and hypophosphatemia.  She was also found to have COVID-19 and newly diagnosed A. fib with RVR and started on beta blocker and Eliquis.  She was treated with IV fluids with improvement in her symptoms, but she still feels weaker than she did prior to going into the hospital.  He has been trying to increase her physical activity, now walking about 30 minutes/day.  She feels like overall her GI symptoms are under good control currently.  Denies significant abdominal pain, nausea, vomiting, or diarrhea.  Her bloating is improved from when she was last seen in GI clinic.   Outpatient Encounter Medications as of 09/13/2021  Medication Sig   amLODipine (NORVASC) 10 MG tablet Take 1 tablet (10 mg total) by mouth daily.   apixaban (ELIQUIS) 2.5 MG TABS tablet Take 1 tablet (2.5 mg total) by mouth 2 (two) times daily.   Biotin 5 MG CAPS Take 5 mg by mouth daily.   CALCIUM PO Take by mouth daily. As directed   carvedilol (COREG) 3.125 MG tablet Take 1 tablet (3.125 mg total) by mouth 2 (two) times daily.   Cholecalciferol (VITAMIN D-3 PO) Take 2,000 Units by mouth daily.   hydrochlorothiazide (HYDRODIURIL) 25 MG tablet Take 12.5 mg by mouth daily. Takes 1/2 daily   lisinopril (PRINIVIL,ZESTRIL) 10 MG tablet Take 10 mg by mouth daily.   lovastatin (MEVACOR) 40 MG tablet Take 40 mg by mouth at bedtime.   Magnesium 400 MG CAPS Take  400 mg by mouth daily.   Multiple Vitamins-Minerals (PRESERVISION AREDS 2+MULTI VIT) CAPS Take 1 tablet by mouth daily.   No facility-administered encounter medications on file as of 09/13/2021.    PHYSICAL EXAM: BP 106/70 (BP Location: Left Arm, Patient Position: Sitting, Cuff Size: Normal)    Pulse 80    Ht 5\' 5"  (1.651 m)    Wt 127 lb 6 oz (57.8 kg)    BMI 21.20 kg/m  Wt Readings from Last 3 Encounters:  09/13/21 127 lb 6 oz (57.8 kg)  09/08/21 125 lb (56.7 kg)  08/16/21 123 lb 9.6 oz (56.1 kg)    General: 88 year old female in NAD.  Head: Normocephalic and atraumatic. Eyes:  Sclerae non-icteric, conjunctive pink. Ears: Slightly decreased auditory acuity Mouth: Dentition intact. No ulcers or lesions.  Neck: Supple, no lymphadenopathy or thyromegaly.  Lungs: No increased WOB Heart: Regular rate Musculoskeletal: Symmetrical with no gross deformities. Skin: Warm and dry. No rash or lesions on visible extremities. Neurological: Alert oriented Psychological:  Alert and cooperative. Normal mood and affect.  Labs 07/2021: H pylori detected. Most recent CBC was unremarkable with nml Hb.  CT A/P w/contrast 08/02/21: IMPRESSION: No acute findings or other significant abnormality. Aortic Atherosclerosis (ICD10-I70.0).  TTE 08/13/21:  1. Left ventricular ejection fraction, by estimation, is 65 to 70%. Left  ventricular ejection fraction by 3D volume is 66 %. The left ventricle has  normal function. The left ventricle  has no regional wall motion  abnormalities. Left ventricular diastolic   function could not be evaluated.   2. Right ventricular systolic function is normal. The right ventricular  size is normal. There is normal pulmonary artery systolic pressure.   3. Trivial mitral valve regurgitation.   4. The aortic valve is normal in structure. Aortic valve regurgitation is  mild. No aortic stenosis is present.   ASSESSMENT AND PLAN:  H pylori infection Patient was diagnosed  with H pylori infection in 07/2021 and was placed on bismuth quadruple therapy. Unfortunately patient developed significant N&V and diarrhea in response to a few days of H pylori therapy. Today we discussed the risks and benefits of treating her H pylori infection. She has minimal GI symptoms at this time. Treatment of her H pylori would reduce her risk of gastric cancer and would reduce her risk of developing gastritis/PUD and IDA. However, she has already had significant side effects in response to one course of therapy, and her risk of gastric cancer may be negligible based upon her advanced age. She may have difficulty tolerating an alternative regimen as well. After our discussion, the patient would like to submit a H pylori stool test in 1-3 months and then decide on whether or not she would like to further treatment at that time. If she were to be treated, could opt for Talicia to try to simplify the treatment regimen. - Stool H pylori antigen  Christia Reading, MD  I spent 40 minutes of time, including in depth chart review, independent review of results as outlined above, communicating results with the patient directly, face-to-face time with the patient, coordinating care, ordering studies and medications as appropriate, and documentation.

## 2021-09-14 ENCOUNTER — Encounter: Payer: Self-pay | Admitting: Interventional Cardiology

## 2021-09-15 NOTE — Telephone Encounter (Signed)
Pat,  In the mean time, I would have her elevate legs for the swelling.  Also would want to know if she ever had this rash before?  It would be odd for a drug rash to only affect her legs, so it's posssible that the rash is not from the medicine.  Thanks.   JV

## 2021-09-20 NOTE — Telephone Encounter (Signed)
COuld change Coreg to atenolol 12.5 mg daily per Megan's recs.  Coreg is one of the least likely  beta blockers to cause rash.   JV

## 2021-10-06 DIAGNOSIS — H40013 Open angle with borderline findings, low risk, bilateral: Secondary | ICD-10-CM | POA: Diagnosis not present

## 2021-10-19 ENCOUNTER — Telehealth: Payer: Self-pay | Admitting: Interventional Cardiology

## 2021-10-19 NOTE — Telephone Encounter (Signed)
Left message to call office.  Patient has been scheduled to see Dr Irish Lack on 10/24/21

## 2021-10-19 NOTE — Telephone Encounter (Signed)
Pt c/o medication issue:  1. Name of Medication: amLODipine (NORVASC) 10 MG tablet apixaban (ELIQUIS) 2.5 MG TABS tablet  2. How are you currently taking this medication (dosage and times per day)? As directed  3. Are you having a reaction (difficulty breathing--STAT)? Patient developed  swelling of her feet and ankles and a rash on her legs. She is not sure which of these medications (if any) could be causing it  4. What is your medication issue? Patient wants to discuss symptoms with MD in person.   Pt c/o swelling: STAT is pt has developed SOB within 24 hours  If swelling, where is the swelling located? Feet and ankles  How much weight have you gained and in what time span? Patient 's weight fluctuates 1-2 pounds from the time she wakes up until she goes to bed  Have you gained 3 pounds in a day or 5 pounds in a week? no  Do you have a log of your daily weights (if so, list)? no  Are you currently taking a fluid pill? no  Are you currently SOB? no  Have you traveled recently? No  Patient started noticing rash and swelling 3 weeks after coming home from the hospital.

## 2021-10-22 NOTE — Progress Notes (Signed)
Cardiology Office Note   Date:  10/24/2021   ID:  Kayla Bass, DOB 1931-10-27, MRN [id number]  PCP:  Kathyrn Lass, MD    Chief Complaint  Patient presents with   Follow-up    Discuss medication changes   AFib  Wt Readings from Last 3 Encounters:  10/24/21 126 lb (57.2 kg)  09/13/21 127 lb 6 oz (57.8 kg)  09/08/21 125 lb (56.7 kg)       History of Present Illness: Kayla Bass is a 88 y.o. female   who is being seen today for the evaluation of atrial fibrillation at the request of Sharyn Creamer, MD.  She was seen for PACs in 2019.   She was admitted in December 2022 with gastritis, dehydration and COVID.  She was found to have atrial fibrillation.  She was started on Eliquis and carvedilol.  She presents for follow-up.   Echocardiogram showed: "Left ventricular ejection fraction, by estimation, is 65 to 70%. Left  ventricular ejection fraction by 3D volume is 66 %. The left ventricle has  normal function. The left ventricle has no regional wall motion  abnormalities. Left ventricular diastolic   function could not be evaluated.   2. Right ventricular systolic function is normal. The right ventricular  size is normal. There is normal pulmonary artery systolic pressure.   3. Trivial mitral valve regurgitation.   4. The aortic valve is normal in structure. Aortic valve regurgitation is  mild. No aortic stenosis is present."   No symptoms with the A. Fib.  Lost 10 lbs.  Taking Boost.    Lives with her son.  In Jan 2023, she developed a rash on her legs. She blamed carvedilol.  We discussed atenolol 12.5 mg daily as an alternative.  She had been on lisinopril years ago.  She was not on amlodipine.  She continues to have ankle swelling which resolves by morning after having her feet up at night.  A few hours into the day, her ankles do swell.  She notes some redness and itching there.  She never had the symptoms prior to her treatment for H. pylori.  Otherwise,  she feels well other than the problems with her feet and ankles.  Past Medical History:  Diagnosis Date   COVID-19    early melanoma of left arm    Essential (primary) hypertension    Irregular heartbeat    Microscopic hematuria    Mixed hyperlipidemia    Osteoporosis     Past Surgical History:  Procedure Laterality Date   APPENDECTOMY     MOLE REMOVAL     cancerous   TOE SURGERY Right    TUBAL LIGATION Bilateral      Current Outpatient Medications  Medication Sig Dispense Refill   amLODipine (NORVASC) 10 MG tablet Take 1 tablet (10 mg total) by mouth daily. 90 tablet 3   apixaban (ELIQUIS) 2.5 MG TABS tablet Take 1 tablet (2.5 mg total) by mouth 2 (two) times daily. 180 tablet 3   Biotin 5 MG CAPS Take 5 mg by mouth daily.     CALCIUM PO Take by mouth daily. As directed     Cholecalciferol (VITAMIN D-3 PO) Take 2,000 Units by mouth daily.     hydrochlorothiazide (HYDRODIURIL) 25 MG tablet Take 12.5 mg by mouth daily. Takes 1/2 daily     lisinopril (PRINIVIL,ZESTRIL) 10 MG tablet Take 10 mg by mouth daily.     lovastatin (MEVACOR) 40 MG tablet Take 40  mg by mouth at bedtime.     Magnesium 400 MG CAPS Take 400 mg by mouth daily.     Multiple Vitamins-Minerals (PRESERVISION AREDS 2+MULTI VIT) CAPS Take 1 tablet by mouth daily.     Probiotic Product (PROBIOTIC-10 PO) Take 1 tablet by mouth daily.     No current facility-administered medications for this visit.    Allergies:   Other, Benicar [olmesartan], and Fosamax [alendronate]    Social History:  The patient  reports that she has never smoked. She has never used smokeless tobacco. She reports current alcohol use. She reports that she does not use drugs.   Family History:  The patient's family history includes Alzheimer's disease in her mother; CAD in her brother, brother, and brother; Healthy in her daughter, sister, sister, sister, and son; Suicidality in her father.    ROS:  Please see the history of present illness.    Otherwise, review of systems are positive for rash on ankles.   All other systems are reviewed and negative.    PHYSICAL EXAM: VS:  BP 126/80    Pulse 82    Ht 5\' 6"  (1.676 m)    Wt 126 lb (57.2 kg)    SpO2 99%    BMI 20.34 kg/m  , BMI Body mass index is 20.34 kg/m. GEN: Well nourished, well developed, in no acute distress HEENT: normal Neck: no JVD, carotid bruits, or masses Cardiac: RRR, premature beats; no murmurs, rubs, or gallops,; bilateral ankle/pretibial edema with macular erythematous areas Respiratory:  clear to auscultation bilaterally, normal work of breathing GI: soft, nontender, nondistended, + BS MS: no deformity or atrophy Skin: warm and dry, no rash Neuro:  Strength and sensation are intact Psych: euthymic mood, full affect    Recent Labs: 08/13/2021: TSH 4.978 08/16/2021: ALT 24; BUN 12; Creatinine, Ser 0.59; Hemoglobin 14.4; Magnesium 1.8; Platelets 163; Potassium 3.4; Sodium 132   Lipid Panel No results found for: CHOL, TRIG, HDL, CHOLHDL, VLDL, LDLCALC, LDLDIRECT   Other studies Reviewed: Additional studies/ records that were reviewed today with results demonstrating: Labs reviewed.   ASSESSMENT AND PLAN:  AFib: In NSR by exam today.  Has been off of carvedilol.  I again explained the rationale for Eliquis in the setting of atrial fibrillation. Acquired thrombophilia: Eliquis for stroke prevention in the setting of atrial fibrillation.  Hemoglobin 14.4 in December 2022 Senile purpura: happened even with aspirin.  Stable HTN: Low-salt diet.  Avoid processed foods.  Decrease amlodipine to 5 mg daily to see if swelling decreases.  I suspect she has venous stasis/insufficiency causing her swelling.  Her localized rash is likely an associated dermatitis. Hyperlipidemia: Whole food, plant-based diet.  After decreasing amlodipine, if swelling resolves and blood pressure controlled, would continue current medications.  If swelling persists and blood pressure  increases, would stop amlodipine and likely increase lisinopril.  If blood pressure stays normal off of amlodipine, could continue other medicines at current doses. No change in rash or swelling after stopping carvedilol.  This would be another option to go back to if her blood pressure increased. If blood pressure increases and she needs follow-up, could be seen in the Pharm.D. hypertension clinic. If swelling persists regardless of medication changes, could refer to Dr. Donnetta Hutching at [facility] to see if there are any other more invasive options.  Current medicines are reviewed at length with the patient today.  The patient concerns regarding her medicines were addressed.  The following changes have been made:  No change  Labs/ tests ordered today include:  No orders of the defined types were placed in this encounter.   Recommend 150 minutes/week of aerobic exercise Low fat, low carb, high fiber diet recommended  Disposition:   FU in 1 year morning and things about normal healing the medicines were circulated through your body whole 24/7 so if it was an allergy you would get better by just from sleeping an hour keeping her feet up so so I do not eat I do not think it is a   Signed, Larae Grooms, MD  10/24/2021 8:42 AM    [address], [place], [place]  [address] Phone: [phone]; Fax: [phone]

## 2021-10-24 ENCOUNTER — Encounter: Payer: Self-pay | Admitting: Interventional Cardiology

## 2021-10-24 ENCOUNTER — Ambulatory Visit: Payer: Medicare Other | Admitting: Interventional Cardiology

## 2021-10-24 ENCOUNTER — Other Ambulatory Visit: Payer: Self-pay

## 2021-10-24 VITALS — BP 126/80 | HR 82 | Ht 66.0 in | Wt 126.0 lb

## 2021-10-24 DIAGNOSIS — I10 Essential (primary) hypertension: Secondary | ICD-10-CM

## 2021-10-24 DIAGNOSIS — I4891 Unspecified atrial fibrillation: Secondary | ICD-10-CM | POA: Diagnosis not present

## 2021-10-24 DIAGNOSIS — M25472 Effusion, left ankle: Secondary | ICD-10-CM

## 2021-10-24 DIAGNOSIS — D692 Other nonthrombocytopenic purpura: Secondary | ICD-10-CM

## 2021-10-24 DIAGNOSIS — E782 Mixed hyperlipidemia: Secondary | ICD-10-CM

## 2021-10-24 DIAGNOSIS — D6869 Other thrombophilia: Secondary | ICD-10-CM

## 2021-10-24 DIAGNOSIS — M25471 Effusion, right ankle: Secondary | ICD-10-CM

## 2021-10-24 MED ORDER — AMLODIPINE BESYLATE 5 MG PO TABS: 5.0000 mg | ORAL_TABLET | Freq: Every day | ORAL | 3 refills | Status: DC

## 2021-10-24 NOTE — Patient Instructions (Addendum)
Medication Instructions:  Your physician has recommended you make the following change in your medication: Decrease amlodipine to 5 mg by mouth daily  *If you need a refill on your cardiac medications before your next appointment, please call your pharmacy*   Lab Work: none If you have labs (blood work) drawn today and your tests are completely normal, you will receive your results only by: [place] (if you have MyChart) OR A paper copy in the mail If you have any lab test that is abnormal or we need to change your treatment, we will call you to review the results.   Testing/Procedures: none   Follow-Up: At [facility], you and your health needs are our priority.  As part of our continuing mission to provide you with exceptional heart care, we have created designated Provider Care Teams.  These Care Teams include your primary Cardiologist (physician) and Advanced Practice Providers (APPs -  Physician Assistants and Nurse Practitioners) who all work together to provide you with the care you need, when you need it.  We recommend signing up for the patient portal called "MyChart".  Sign up information is provided on this After Visit Summary.  MyChart is used to connect with patients for Virtual Visits (Telemedicine).  Patients are able to view lab/test results, encounter notes, upcoming appointments, etc.  Non-urgent messages can be sent to your provider as well.   To learn more about what you can do with MyChart, go to [email or web address].    Your next appointment:   7 month(s)  The format for your next appointment:   In Person  Provider:   Larae Grooms, MD     Other Instructions  Please continue to monitor your blood pressure.  Send in readings and update on swelling through my chart

## 2021-12-27 DIAGNOSIS — I10 Essential (primary) hypertension: Secondary | ICD-10-CM | POA: Diagnosis not present

## 2021-12-27 DIAGNOSIS — I4891 Unspecified atrial fibrillation: Secondary | ICD-10-CM | POA: Diagnosis not present

## 2021-12-27 DIAGNOSIS — E782 Mixed hyperlipidemia: Secondary | ICD-10-CM | POA: Diagnosis not present

## 2021-12-27 DIAGNOSIS — E871 Hypo-osmolality and hyponatremia: Secondary | ICD-10-CM | POA: Diagnosis not present

## 2021-12-27 DIAGNOSIS — M810 Age-related osteoporosis without current pathological fracture: Secondary | ICD-10-CM | POA: Diagnosis not present

## 2021-12-27 DIAGNOSIS — I700 Atherosclerosis of aorta: Secondary | ICD-10-CM | POA: Diagnosis not present

## 2021-12-29 DIAGNOSIS — Z23 Encounter for immunization: Secondary | ICD-10-CM | POA: Diagnosis not present

## 2021-12-29 DIAGNOSIS — Z0000 Encounter for general adult medical examination without abnormal findings: Secondary | ICD-10-CM | POA: Diagnosis not present

## 2022-01-15 DIAGNOSIS — Z85820 Personal history of malignant melanoma of skin: Secondary | ICD-10-CM | POA: Diagnosis not present

## 2022-01-15 DIAGNOSIS — L570 Actinic keratosis: Secondary | ICD-10-CM | POA: Diagnosis not present

## 2022-01-15 DIAGNOSIS — D692 Other nonthrombocytopenic purpura: Secondary | ICD-10-CM | POA: Diagnosis not present

## 2022-01-15 DIAGNOSIS — C4402 Squamous cell carcinoma of skin of lip: Secondary | ICD-10-CM | POA: Diagnosis not present

## 2022-01-15 DIAGNOSIS — Z85828 Personal history of other malignant neoplasm of skin: Secondary | ICD-10-CM | POA: Diagnosis not present

## 2022-01-15 DIAGNOSIS — D1801 Hemangioma of skin and subcutaneous tissue: Secondary | ICD-10-CM | POA: Diagnosis not present

## 2022-01-15 DIAGNOSIS — L814 Other melanin hyperpigmentation: Secondary | ICD-10-CM | POA: Diagnosis not present

## 2022-01-15 DIAGNOSIS — L821 Other seborrheic keratosis: Secondary | ICD-10-CM | POA: Diagnosis not present

## 2022-01-15 DIAGNOSIS — D225 Melanocytic nevi of trunk: Secondary | ICD-10-CM | POA: Diagnosis not present

## 2022-01-15 DIAGNOSIS — L720 Epidermal cyst: Secondary | ICD-10-CM | POA: Diagnosis not present

## 2022-02-26 DIAGNOSIS — C4402 Squamous cell carcinoma of skin of lip: Secondary | ICD-10-CM | POA: Diagnosis not present

## 2022-03-19 ENCOUNTER — Other Ambulatory Visit: Payer: Medicare Other

## 2022-03-19 DIAGNOSIS — A048 Other specified bacterial intestinal infections: Secondary | ICD-10-CM | POA: Diagnosis not present

## 2022-03-20 ENCOUNTER — Encounter: Payer: Self-pay | Admitting: Physician Assistant

## 2022-03-20 ENCOUNTER — Ambulatory Visit: Payer: Medicare Other | Admitting: Physician Assistant

## 2022-03-20 VITALS — BP 130/70 | HR 80 | Ht 64.0 in | Wt 125.0 lb

## 2022-03-20 DIAGNOSIS — K219 Gastro-esophageal reflux disease without esophagitis: Secondary | ICD-10-CM

## 2022-03-20 DIAGNOSIS — R1013 Epigastric pain: Secondary | ICD-10-CM | POA: Diagnosis not present

## 2022-03-20 DIAGNOSIS — A048 Other specified bacterial intestinal infections: Secondary | ICD-10-CM

## 2022-03-20 LAB — HELICOBACTER PYLORI  SPECIAL ANTIGEN
MICRO NUMBER:: 13685534
SPECIMEN QUALITY: ADEQUATE

## 2022-03-20 MED ORDER — PANTOPRAZOLE SODIUM 40 MG PO TBEC: 40.0000 mg | DELAYED_RELEASE_TABLET | Freq: Two times a day (BID) | ORAL | 5 refills | Status: DC

## 2022-03-20 NOTE — Progress Notes (Addendum)
Chief Complaint: GERD, H.pylori  HPI:    Kayla Bass is an 88 year old female with a past medical history as listed below, known to Dr. Lorenso Courier, who returns to clinic today for complaint of GERD.     08/02/2021 CT the abdomen pelvis with contrast with aortic atherosclerosis and otherwise normal.    09/13/2021 patient seen in follow-up by Dr. Lorenso Courier.  At that time discussed that she was able to tolerate only a few days of H. pylori therapy for experiencing nausea, vomiting and diarrhea.  He was severe enough to lead to an admission to the hospital 12/18-12/21/2022.  At that time patient had minimal GI symptoms and had significant side effects from bismuth quadruple therapy.  Recommended repeat H. pylori antigen testing in 1 to 3 months.  Discussed possible Talicia if needed to be treated.    03/19/2022 patient brought in her sample for H. pylori stool antigen.    Today, the patient presents to clinic accompanied by her son.  She explains that she has had increased reflux symptoms with nausea and some epigastric discomfort.  She believes she is taking a PPI before dinner and she has been doing this daily for the past 6 weeks but is still having a lot of symptoms and also tells me she can only eat small amounts of bland food throughout the day.  Explains that she just turned in her stool sample yesterday for H. pylori.  Asked if she can be retreated with a different agent.  Her son is very worried that she will end up back in the hospital with severe dehydration with side effects from treatment.    Denies fever, chills, change in bowel habits or symptoms that awaken her from sleep.  Past Medical History:  Diagnosis Date   COVID-19    early melanoma of left arm    Essential (primary) hypertension    Irregular heartbeat    Microscopic hematuria    Mixed hyperlipidemia    Osteoporosis     Past Surgical History:  Procedure Laterality Date   APPENDECTOMY     MOLE REMOVAL     cancerous   TOE  SURGERY Right    TUBAL LIGATION Bilateral     Current Outpatient Medications  Medication Sig Dispense Refill   amLODipine (NORVASC) 5 MG tablet Take 1 tablet (5 mg total) by mouth daily. 90 tablet 3   apixaban (ELIQUIS) 2.5 MG TABS tablet Take 1 tablet (2.5 mg total) by mouth 2 (two) times daily. 180 tablet 3   Biotin 5 MG CAPS Take 5 mg by mouth daily.     CALCIUM PO Take by mouth daily. As directed     Cholecalciferol (VITAMIN D-3 PO) Take 2,000 Units by mouth daily.     hydrochlorothiazide (HYDRODIURIL) 25 MG tablet Take 12.5 mg by mouth daily. Takes 1/2 daily     lisinopril (PRINIVIL,ZESTRIL) 10 MG tablet Take 10 mg by mouth daily.     lovastatin (MEVACOR) 40 MG tablet Take 40 mg by mouth at bedtime.     Magnesium 400 MG CAPS Take 400 mg by mouth daily.     Multiple Vitamins-Minerals (PRESERVISION AREDS 2+MULTI VIT) CAPS Take 1 tablet by mouth daily.     Probiotic Product (PROBIOTIC-10 PO) Take 1 tablet by mouth daily.     No current facility-administered medications for this visit.    Allergies as of 03/20/2022 - Review Complete 10/24/2021  Allergen Reaction Noted   Other  11/09/2016   Benicar [olmesartan]  11/02/2016  Fosamax [alendronate]  06/26/2021    Family History  Problem Relation Age of Onset   Alzheimer's disease Mother    Suicidality Father    Healthy Sister    CAD Brother    CAD Brother    CAD Brother    Healthy Sister    Healthy Sister    Healthy Daughter    Healthy Son     Social History   Socioeconomic History   Marital status: Widowed    Spouse name: Not on file   Number of children: Not on file   Years of education: Not on file   Highest education level: Not on file  Occupational History   Occupation: retired  Tobacco Use   Smoking status: Never   Smokeless tobacco: Never  Vaping Use   Vaping Use: Never used  Substance and Sexual Activity   Alcohol use: Yes    Comment: occassionally   Drug use: Never   Sexual activity: Not on file   Other Topics Concern   Not on file  Social History Narrative   Not on file   Social Determinants of Health   Financial Resource Strain: Not on file  Food Insecurity: Not on file  Transportation Needs: Not on file  Physical Activity: Not on file  Stress: Not on file  Social Connections: Not on file  Intimate Partner Violence: Not on file    Review of Systems:    Constitutional: No weight loss, fever or chills Cardiovascular: No chest pain   Respiratory: No SOB  Gastrointestinal: See HPI and otherwise negative   Physical Exam:  Vital signs: BP 130/70 (BP Location: Left Arm, Patient Position: Sitting, Cuff Size: Normal)   Pulse 80 Comment: irregular  Ht '5\' 4"'$  (1.626 m) Comment: height measured without shoes  Wt 125 lb (56.7 kg)   BMI 21.46 kg/m    Constitutional:   Pleasant Elderly Caucasian female appears to be in NAD, Well developed, Well nourished, alert and cooperative Respiratory: Respirations even and unlabored. Lungs clear to auscultation bilaterally.   No wheezes, crackles, or rhonchi.  Cardiovascular: Normal S1, S2. No MRG. Regular rate and rhythm. No peripheral edema, cyanosis or pallor.  Gastrointestinal:  Soft, nondistended, mild epigastric ttp, No rebound or guarding. Normal bowel sounds. No appreciable masses or hepatomegaly. Rectal:  Not performed.  Psychiatric: Oriented to person, place and time. Demonstrates good judgement and reason without abnormal affect or behaviors.  RELEVANT LABS AND IMAGING: CBC    Component Value Date/Time   WBC 8.7 08/16/2021 0456   RBC 4.61 08/16/2021 0456   HGB 14.4 08/16/2021 0456   HCT 40.9 08/16/2021 0456   PLT 163 08/16/2021 0456   MCV 88.7 08/16/2021 0456   MCH 31.2 08/16/2021 0456   MCHC 35.2 08/16/2021 0456   RDW 12.7 08/16/2021 0456   LYMPHSABS 2.0 08/16/2021 0456   MONOABS 0.8 08/16/2021 0456   EOSABS 0.0 08/16/2021 0456   BASOSABS 0.0 08/16/2021 0456    CMP     Component Value Date/Time   NA 132 (L)  08/16/2021 0456   K 3.4 (L) 08/16/2021 0456   CL 102 08/16/2021 0456   CO2 24 08/16/2021 0456   GLUCOSE 107 (H) 08/16/2021 0456   BUN 12 08/16/2021 0456   CREATININE 0.59 08/16/2021 0456   CALCIUM 8.2 (L) 08/16/2021 0456   PROT 5.8 (L) 08/16/2021 0456   ALBUMIN 3.4 (L) 08/16/2021 0456   AST 41 08/16/2021 0456   ALT 24 08/16/2021 0456   ALKPHOS 45 08/16/2021 0456  BILITOT 0.6 08/16/2021 0456   GFRNONAA >60 08/16/2021 0456    Assessment: 1.  GERD: Likely due to H. pylori and gastritis 2.  Nausea and epigastric pain: Likely due to H. pylori and gastritis 3.  Positive H. pylori: Previously tried treatment with bismuth quadruple therapy but had severe nausea, vomiting and diarrhea which landed her in the hospital with electrolyte abnormalities and new A-fib due to dehydration, recheck of H. pylori stool antigen was submitted yesterday  Plan: 1.  Discussed with patient and her son that we will await results from recent H. pylori stool sample.  Hopefully we will get this back later this week or the beginning of next week.  If it is positive we will start her on Talicia as she did not tolerate bismuth quadruple therapy in the past. 2.  Patient was offered follow-up appointment with me next week with ideally starting her Pat Kocher the day before, but opted to wait until 8/8 and is scheduled with Nicoletta Ba PA to check on her and make sure she is not dehydrated and doing okay with the [place]. She will wait to start her treatment until Monday 8/7 if needed. When we send in the [place] would also recommend sending in some Zofran 4 mg to take 1-2 tabs every 4-6 hours as needed for any of those symptoms #30 with 1 refill. 3.  Also recommend patient increase her Pantoprazole to 40 mg twice daily, 30-60 minutes before breakfast and again before dinner in the interim.  Sent in a prescription #60 with 3 refills. 4.  If patient is unable to tolerate therapy then may need to proceed with EGD for further  evaluation. 5.  Patient to follow in clinic with [place] per recommendations as above.  Ellouise Newer, PA-C [facility] 03/20/2022, 10:07 AM  Cc: Kathyrn Lass, MD

## 2022-03-20 NOTE — Progress Notes (Signed)
I agree with the assessment and plan as outlined by Kayla Bass. 

## 2022-03-20 NOTE — Patient Instructions (Addendum)
We have sent the following medications to your pharmacy for you to pick up at your convenience: Pantoprazole 40 mg twice daily 30-60 minutes before breakfast and dinner.  Follow up with Amy on Tuesday 04/03/22 @ 1:30 pm.   If you are age 86 or older, your body mass index should be between 23-30. Your Body mass index is 21.46 kg/m. If this is out of the aforementioned range listed, please consider follow up with your Primary Care Provider.  If you are age 76 or younger, your body mass index should be between 19-25. Your Body mass index is 21.46 kg/m. If this is out of the aformentioned range listed, please consider follow up with your Primary Care Provider.   ________________________________________________________  The [facility] providers would like to encourage you to use [facility] to communicate with providers for non-urgent requests or questions.  Due to long hold times on the telephone, sending your provider a message by [facility] may be a faster and more efficient way to get a response.  Please allow 48 business hours for a response.  Please remember that this is for non-urgent requests.  _______________________________________________________

## 2022-03-27 ENCOUNTER — Telehealth: Payer: Self-pay | Admitting: Physician Assistant

## 2022-03-27 ENCOUNTER — Encounter: Payer: Self-pay | Admitting: Internal Medicine

## 2022-03-27 NOTE — Telephone Encounter (Signed)
Inbound call from patient wanting to discuss results from H pylori test. Please advise.

## 2022-03-27 NOTE — Telephone Encounter (Signed)
Returned call to patient. I informed her that Dr. Lorenso Courier sent her a MyChart message on 03/20/22. I read message from Dr. Lorenso Courier. Pt has been advised to continue Pantoprazole 40 mg BID and keep her follow up appt with Nicoletta Ba, PA-C as scheduled. Pt verbalized understanding and had no concerns at the end of the call.

## 2022-03-28 ENCOUNTER — Other Ambulatory Visit: Payer: Self-pay

## 2022-03-28 DIAGNOSIS — A048 Other specified bacterial intestinal infections: Secondary | ICD-10-CM

## 2022-03-29 ENCOUNTER — Ambulatory Visit: Payer: Medicare Other | Admitting: Physician Assistant

## 2022-04-03 ENCOUNTER — Ambulatory Visit: Payer: Medicare Other | Admitting: Physician Assistant

## 2022-04-05 ENCOUNTER — Other Ambulatory Visit: Payer: Medicare Other

## 2022-04-24 ENCOUNTER — Other Ambulatory Visit: Payer: Medicare Other

## 2022-04-24 DIAGNOSIS — A048 Other specified bacterial intestinal infections: Secondary | ICD-10-CM

## 2022-04-25 LAB — HELICOBACTER PYLORI  SPECIAL ANTIGEN
MICRO NUMBER:: 13846824
SPECIMEN QUALITY: ADEQUATE

## 2022-04-26 ENCOUNTER — Other Ambulatory Visit: Payer: Self-pay

## 2022-04-27 DIAGNOSIS — Z1231 Encounter for screening mammogram for malignant neoplasm of breast: Secondary | ICD-10-CM | POA: Diagnosis not present

## 2022-05-15 ENCOUNTER — Other Ambulatory Visit: Payer: Self-pay

## 2022-05-15 DIAGNOSIS — Z23 Encounter for immunization: Secondary | ICD-10-CM | POA: Diagnosis not present

## 2022-05-15 MED ORDER — AMLODIPINE BESYLATE 5 MG PO TABS: 5.0000 mg | ORAL_TABLET | Freq: Every day | ORAL | 1 refills | Status: DC

## 2022-06-12 ENCOUNTER — Ambulatory Visit: Payer: Medicare Other | Admitting: Internal Medicine

## 2022-06-12 ENCOUNTER — Encounter: Payer: Self-pay | Admitting: Internal Medicine

## 2022-06-12 VITALS — BP 140/90 | HR 83 | Ht 65.5 in | Wt 125.1 lb

## 2022-06-12 DIAGNOSIS — R142 Eructation: Secondary | ICD-10-CM

## 2022-06-12 DIAGNOSIS — K219 Gastro-esophageal reflux disease without esophagitis: Secondary | ICD-10-CM | POA: Diagnosis not present

## 2022-06-12 DIAGNOSIS — R198 Other specified symptoms and signs involving the digestive system and abdomen: Secondary | ICD-10-CM

## 2022-06-12 DIAGNOSIS — R140 Abdominal distension (gaseous): Secondary | ICD-10-CM | POA: Diagnosis not present

## 2022-06-12 MED ORDER — ESOMEPRAZOLE MAGNESIUM 40 MG PO CPDR: 40.0000 mg | DELAYED_RELEASE_CAPSULE | Freq: Every day | ORAL | 11 refills | Status: DC

## 2022-06-12 NOTE — Progress Notes (Signed)
Chief Complaint: GERD, H.pylori  HPI:    Kayla Bass is an 88 year old female with history of GERD and H pylori presents for follow up of GERD and abdominal bloating  Interval History: Her stomach feels full and she has burping. This burping seems to be worse at night. Depending on what she eats for lunch, it can sometimes bother her. The ab pain feels full. She felt like the pantoprazole seemed to take aware her energy. She occasionally takes Gas-X and Pepto bismol that seems to help. Denies use of carbonated beverages. Denies dysphagia. She is eating well. Having 2-3 BMs now, which is increased from in the past when she was having 1 BM per day.  Wt Readings from Last 3 Encounters:  06/12/22 125 lb 2 oz (56.8 kg)  03/20/22 125 lb (56.7 kg)  10/24/21 126 lb (57.2 kg)   Current Outpatient Medications  Medication Sig Dispense Refill   amLODipine (NORVASC) 5 MG tablet Take 1 tablet (5 mg total) by mouth daily. 90 tablet 1   apixaban (ELIQUIS) 2.5 MG TABS tablet Take 1 tablet (2.5 mg total) by mouth 2 (two) times daily. 180 tablet 3   Biotin 5 MG CAPS Take 5 mg by mouth daily.     CALCIUM PO Take by mouth daily. As directed     Cholecalciferol (VITAMIN D-3 PO) Take 2,000 Units by mouth daily.     EFUDEX 5 % cream Apply 1 Application topically as needed.     hydrochlorothiazide (HYDRODIURIL) 25 MG tablet Take 12.5 mg by mouth daily. Takes 1/2 daily     lisinopril (PRINIVIL,ZESTRIL) 10 MG tablet Take 10 mg by mouth daily.     lovastatin (MEVACOR) 40 MG tablet Take 40 mg by mouth at bedtime.     Magnesium 400 MG CAPS Take 400 mg by mouth daily.     Multiple Vitamins-Minerals (PRESERVISION AREDS 2+MULTI VIT) CAPS Take 1 tablet by mouth daily.     Probiotic Product (PROBIOTIC-10 PO) Take 1 tablet by mouth daily.     pantoprazole (PROTONIX) 40 MG tablet Take 1 tablet (40 mg total) by mouth 2 (two) times daily before a meal. (Patient not taking: Reported on 06/12/2022) 60 tablet 5   No  current facility-administered medications for this visit.     Physical Exam:  Vital signs: BP (!) 140/90   Pulse 83   Ht 5' 5.5" (1.664 m)   Wt 125 lb 2 oz (56.8 kg)   BMI 20.51 kg/m    Constitutional:   Pleasant Elderly Caucasian female appears to be in NAD, Well developed, Well nourished, alert and cooperative Respiratory: Respirations even and unlabored. Lungs clear to auscultation bilaterally.   No wheezes, crackles, or rhonchi.  Cardiovascular: Irregularly irregular Gastrointestinal:  Soft, nondistended, non-tender. No rebound or guarding. Normal bowel sounds. No appreciable masses or hepatomegaly. Rectal:  Not performed.  Psychiatric: Oriented to person, place and time. Demonstrates good judgement and reason without abnormal affect or behaviors.  RELEVANT LABS AND IMAGING: CBC    Component Value Date/Time   WBC 8.7 08/16/2021 0456   RBC 4.61 08/16/2021 0456   HGB 14.4 08/16/2021 0456   HCT 40.9 08/16/2021 0456   PLT 163 08/16/2021 0456   MCV 88.7 08/16/2021 0456   MCH 31.2 08/16/2021 0456   MCHC 35.2 08/16/2021 0456   RDW 12.7 08/16/2021 0456   LYMPHSABS 2.0 08/16/2021 0456   MONOABS 0.8 08/16/2021 0456   EOSABS 0.0 08/16/2021 0456   BASOSABS 0.0 08/16/2021 0456  CMP     Component Value Date/Time   NA 132 (L) 08/16/2021 0456   K 3.4 (L) 08/16/2021 0456   CL 102 08/16/2021 0456   CO2 24 08/16/2021 0456   GLUCOSE 107 (H) 08/16/2021 0456   BUN 12 08/16/2021 0456   CREATININE 0.59 08/16/2021 0456   CALCIUM 8.2 (L) 08/16/2021 0456   PROT 5.8 (L) 08/16/2021 0456   ALBUMIN 3.4 (L) 08/16/2021 0456   AST 41 08/16/2021 0456   ALT 24 08/16/2021 0456   ALKPHOS 45 08/16/2021 0456   BILITOT 0.6 08/16/2021 0456   GFRNONAA >60 08/16/2021 0456    Assessment: GERD Abdominal bloating History of H. Pylori On last two H pylori stool tests were found to be negative. Thus I would presume that her initial course of H pylori treatment (leading to her prior  hospitalization) was effective. Unclear what is leading to her bloating and belching symptoms but could be related to diet or GERD. Will start her on daily Nexium to see if this helps with her symptoms. Will also plan for SIBO testing.  Plan: - Start Nexium 40 mg QD - Avoid beans. Can add back plant-based burgers - Okay to keep using Gas-X and Pepto Bismol - SIBO testing   Christia Reading, MD [facility] 06/12/2022, 10:37 AM  I spent 41 minutes of time, including in depth chart review, independent review of results as outlined above, communicating results with the patient directly, face-to-face time with the patient, coordinating care, ordering studies and medications as appropriate, and documentation.

## 2022-06-12 NOTE — Patient Instructions (Signed)
Stop taking pantoprazole.  We have sent the following medications to your pharmacy for you to pick up at your convenience: esomeprazole.   Avoid beans.   You have been given a testing kit to check for small intestine bacterial overgrowth (SIBO) which is completed by a company named [facility]. Make sure to return your test in the mail using the return mailing label given to you along with the kit. Your demographic and insurance information have already been sent to the company and they should be in contact with you over the next 1-2 weeks regarding this test. [facility] will collect an upfront charge of $99.74 for commercial insurance plans and $209.74 is you are paying cash. Make sure to discuss with [facility] PRIOR to having the test to see if they have gotten information from your insurance company as to how much your testing will cost out of pocket, if any. Please keep in mind that you will be getting a call from phone number [phone] or a similar number. If you do not hear from them within this time frame, please call our office at [phone] or call [facility] directly at [phone].   The [facility] providers would like to encourage you to use [facility] to communicate with providers for non-urgent requests or questions.  Due to long hold times on the telephone, sending your provider a message by [facility] may be a faster and more efficient way to get a response.  Please allow 48 business hours for a response.  Please remember that this is for non-urgent requests.   Due to recent changes in healthcare laws, you may see the results of your imaging and laboratory studies on MyChart before your provider has had a chance to review them.  We understand that in some cases there may be results that are confusing or concerning to you. Not all laboratory results come back in the same time frame and the provider may be waiting for multiple results in order to interpret others.   Please give [place] 48 hours in order for your provider to thoroughly review all the results before contacting the office for clarification of your results.

## 2022-06-21 DIAGNOSIS — D3132 Benign neoplasm of left choroid: Secondary | ICD-10-CM | POA: Diagnosis not present

## 2022-06-21 DIAGNOSIS — H524 Presbyopia: Secondary | ICD-10-CM | POA: Diagnosis not present

## 2022-06-21 DIAGNOSIS — H35013 Changes in retinal vascular appearance, bilateral: Secondary | ICD-10-CM | POA: Diagnosis not present

## 2022-06-21 DIAGNOSIS — H40013 Open angle with borderline findings, low risk, bilateral: Secondary | ICD-10-CM | POA: Diagnosis not present

## 2022-06-21 DIAGNOSIS — H35033 Hypertensive retinopathy, bilateral: Secondary | ICD-10-CM | POA: Diagnosis not present

## 2022-06-29 NOTE — Progress Notes (Unsigned)
Cardiology Office Note:    Date:  07/02/2022   ID:  Kayla Bass, DOB 07-19-1932, MRN [id number]  PCP:  Kayla Lass, MD   [facility] Providers Cardiologist:  Kayla Grooms, MD     Referring MD: Kayla Lass, MD   Chief Complaint: follow-up atrial fibrillation  History of Present Illness:    Kayla Bass is a very pleasant 88 y.o. female with a hx of atrial fibrillation on chronic anticoagulation, HTN, HLD, and aortic atherosclerosis.   Initially seen by Dr. Irish Bass 10/2017 at which time she reported a history of skipped beats as far as 30 years previous, was told it was a benign finding. At at routine appointment with PCP, an irregular heartbeat was heard and she was diagnosed with PACs.  She was advised to follow-up as needed.  Referred to cardiology and seen by Dr. Irish Bass on 10/24/21 following admission December 2022 with gastritis, dehydration, and COVID.  Was found to have atrial fibrillation and was started on Eliquis and carvedilol. Echocardiogram showed normal LVEF 65 to 70%, no RWMA, unable to evaluate diastolic function, normal RV, normal pulmonary artery systolic pressure, mild AI. She was asymptomatic with A-fib.  Had lost 10 pounds during her sickness. She reported a rash on legs thought to be 2/2 carvedilol and she stopped it. Having leg swelling that starts a few hours after waking and resolves overnight. Was advised to decrease amlodipine to 5 mg daily for leg swelling. Direction was given by Dr. Irish Bass for medication management if swelling did not improve or if BP increased. She was advised to return in 7 months for follow-up.   Today, she is here alone for follow-up.  Overall doing well and actually feels like she has more energy recently. Walking 20 minutes 3-4 times per week with no symptoms of chest pain, dyspnea, or palpitations with exertion. Has mild LE edema at the end of the day occasionally that resolves overnight. Recent visit with GI, no  evidence of h pylori. Is working on diet modifications to see if epigastric discomfort improves. She denies fatigue, melena, hematuria, hemoptysis, diaphoresis, weakness, presyncope, syncope, orthopnea, and PND. No specific cardiac complaints today. Eliquis cost is high for her right now. She does not qualify for patient assistance and is not interested in switching to coumadin. Has soon follow-up with PCP and will discuss BP and cholesterol management as noted in assessment and plan.   Past Medical History:  Diagnosis Date   COVID-19    early melanoma of left arm    Essential (primary) hypertension    H. pylori infection    Irregular heartbeat    Microscopic hematuria    Mixed hyperlipidemia    Osteoporosis     Past Surgical History:  Procedure Laterality Date   APPENDECTOMY     MOLE REMOVAL     cancerous   TOE SURGERY Right    TUBAL LIGATION Bilateral     Current Medications: Current Meds  Medication Sig   amLODipine (NORVASC) 5 MG tablet Take 1 tablet (5 mg total) by mouth daily.   apixaban (ELIQUIS) 2.5 MG TABS tablet Take 1 tablet (2.5 mg total) by mouth 2 (two) times daily.   Biotin 5 MG CAPS Take 5 mg by mouth daily.   CALCIUM PO Take 1 tablet by mouth daily. As directed   Cholecalciferol (VITAMIN D-3 PO) Take 2,000 Units by mouth daily.   EFUDEX 5 % cream Apply 1 Application topically as needed.   esomeprazole (NEXIUM) 40 MG  capsule Take 40 mg by mouth daily at 12 noon.   hydrochlorothiazide (HYDRODIURIL) 25 MG tablet Take 12.5 mg by mouth daily. Takes 1/2 daily   lisinopril (PRINIVIL,ZESTRIL) 10 MG tablet Take 10 mg by mouth daily.   lovastatin (MEVACOR) 40 MG tablet Take 40 mg by mouth at bedtime.   Magnesium 400 MG CAPS Take 400 mg by mouth daily.   Multiple Vitamins-Minerals (PRESERVISION AREDS 2+MULTI VIT) CAPS Take 1 tablet by mouth daily.   Probiotic Product (PROBIOTIC-10 PO) Take 1 tablet by mouth daily.     Allergies:   Other, Benicar [olmesartan], and  Fosamax [alendronate]   Social History   Socioeconomic History   Marital status: Widowed    Spouse name: Not on file   Number of children: Not on file   Years of education: Not on file   Highest education level: Not on file  Occupational History   Occupation: retired  Tobacco Use   Smoking status: Never   Smokeless tobacco: Never  Vaping Use   Vaping Use: Never used  Substance and Sexual Activity   Alcohol use: Yes    Comment: occassionally   Drug use: Never   Sexual activity: Not on file  Other Topics Concern   Not on file  Social History Narrative   Not on file   Social Determinants of Health   Financial Resource Strain: Not on file  Food Insecurity: Not on file  Transportation Needs: Not on file  Physical Activity: Not on file  Stress: Not on file  Social Connections: Not on file     Family History: The patient's family history includes Alzheimer's disease in her mother; CAD in her brother, brother, and brother; Healthy in her daughter, sister, sister, sister, and son; Suicidality in her father.  ROS:   Please see the history of present illness.   All other systems reviewed and are negative.  Labs/Other Studies Reviewed:    The following studies were reviewed today:  Echo 08/13/21  1. Left ventricular ejection fraction, by estimation, is 65 to 70%. Left  ventricular ejection fraction by 3D volume is 66 %. The left ventricle has  normal function. The left ventricle has no regional wall motion  abnormalities. Left ventricular diastolic   function could not be evaluated.   2. Right ventricular systolic function is normal. The right ventricular  size is normal. There is normal pulmonary artery systolic pressure.   3. Trivial mitral valve regurgitation.   4. The aortic valve is normal in structure. Aortic valve regurgitation is  mild. No aortic stenosis is present.  Recent Labs: 08/13/2021: TSH 4.978 08/16/2021: ALT 24; BUN 12; Creatinine, Ser 0.59;  Hemoglobin 14.4; Magnesium 1.8; Platelets 163; Potassium 3.4; Sodium 132  Recent Lipid Panel No results found for: "CHOL", "TRIG", "HDL", "CHOLHDL", "VLDL", "LDLCALC", "LDLDIRECT"   Risk Assessment/Calculations:    CHA2DS2-VASc Score = 4  This indicates a 4.8% annual risk of stroke. The patient's score is based upon: CHF History: 0 HTN History: 1 Diabetes History: 0 Stroke History: 0 Vascular Disease History: 0 Age Score: 2 Gender Score: 1   Physical Exam:    VS:  BP (!) 158/84   Pulse 79   Ht '5\' 4"'$  (1.626 m)   Wt 127 lb 3.2 oz (57.7 kg)   SpO2 99%   BMI 21.83 kg/m     Wt Readings from Last 3 Encounters:  07/02/22 127 lb 3.2 oz (57.7 kg)  06/12/22 125 lb 2 oz (56.8 kg)  03/20/22 125 lb (  56.7 kg)     GEN:  Petite in no acute distress, appears younger than stated age 47: Normal NECK: No JVD; No carotid bruits CARDIAC: RRR, no murmurs, rubs, gallops RESPIRATORY:  Clear to auscultation without rales, wheezing or rhonchi  ABDOMEN: Soft, non-tender, non-distended MUSCULOSKELETAL:  No edema; No deformity. 2+ pedal pulses, equal bilaterally SKIN: Warm and dry NEUROLOGIC:  Alert and oriented x 3 PSYCHIATRIC:  Normal affect   EKG:  EKG is ordered today.  The ekg ordered today demonstrates sinus rhythm with PACs at 79 bpm, nonspecific TW abnormality   HYPERTENSION CONTROL Vitals:   07/02/22 1012 07/02/22 1044  BP: (!) 170/88 (!) 158/84    The patient's blood pressure is elevated above target today.  In order to address the patient's elevated BP: The blood pressure is usually elevated in clinic.  Blood pressures monitored at home have been optimal.; Blood pressure will be monitored at home to determine if medication changes need to be made.      Diagnoses:    1. PAF (paroxysmal atrial fibrillation) ([place])   2. Essential hypertension   3. Hyperlipidemia LDL goal <70   4. Aortic atherosclerosis (HCC)    Assessment and Plan:     PAF on chronic anticoagulation:  SR with PACs on EKG today, HR well controlled. Not on AV nodal blocking agent, previously intolerant of carvedilol. Asymptomatic with a fib. Currently feeling well, reports having more energy recently. Exercising on a consistent basis. No bleeding concerns. Eliquis 2.5 mg twice daily is appropriate dose for her age/weight. No changes to medical therapy today.   Hypertension: BP is elevated. She reports history of white coat hypertension. Home SBP typically 135 mmHg. Advised her to monitor on and report back if BP consistently > 140 or > 80.   Hyperlipidemia LDL goal < 70/Aortic atherosclerosis: Aortic atherosclerosis noted on CT 07/2021. LDL 86 on 12/27/21. We discussed trying to achieve lower LDL in the setting of aortic atherosclerosis. Would favor atorvastatin or rosuvastatin in the place of lovastatin. She would like to discuss with PCP.     Disposition: 1 year with Dr. Irish Bass  Medication Adjustments/Labs and Tests Ordered: Current medicines are reviewed at length with the patient today.  Concerns regarding medicines are outlined above.  Orders Placed This Encounter  Procedures   EKG 12-Lead   No orders of the defined types were placed in this encounter.   Patient Instructions  Medication Instructions:   Your physician recommends that you continue on your current medications as directed. Please refer to the Current Medication list given to you today.   *If you need a refill on your cardiac medications before your next appointment, please call your pharmacy*   Lab Work:  None ordered.  If you have labs (blood work) drawn today and your tests are completely normal, you will receive your results only by: [place] (if you have MyChart) OR A paper copy in the mail If you have any lab test that is abnormal or we need to change your treatment, we will call you to review the results.   Testing/Procedures:  None ordered.   Follow-Up: At [facility], you and your  health needs are our priority.  As part of our continuing mission to provide you with exceptional heart care, we have created designated Provider Care Teams.  These Care Teams include your primary Cardiologist (physician) and Advanced Practice Providers (APPs -  Physician Assistants and Nurse Practitioners) who all work together to provide you with  the care you need, when you need it.  We recommend signing up for the patient portal called "MyChart".  Sign up information is provided on this After Visit Summary.  MyChart is used to connect with patients for Virtual Visits (Telemedicine).  Patients are able to view lab/test results, encounter notes, upcoming appointments, etc.  Non-urgent messages can be sent to your provider as well.   To learn more about what you can do with MyChart, go to [email or web address].    Your next appointment:   1 year(s)  The format for your next appointment:   In Person  Provider:   Larae Grooms, MD     Other Instructions  Your physician wants you to follow-up in: 1 year with Dr. Glennon Hamilton will receive a reminder letter in the mail two months in advance. If you don't receive a letter, please call our office to schedule the follow-up appointment.  HOW TO TAKE YOUR BLOOD PRESSURE: Rest 5 minutes before taking your blood pressure.  Don't smoke or drink caffeinated beverages for at least 30 minutes before. Take your blood pressure before (not after) you eat. Sit comfortably with your back supported and both feet on the floor (don't cross your legs). Elevate your arm to heart level on a table or a desk. Use the proper sized cuff. It should fit smoothly and snugly around your bare upper arm. There should be enough room to slip a fingertip under the cuff. The bottom edge of the cuff should be 1 inch above the crease of the elbow. Please monitor your blood pressure and if your blood pressure consistently remains above 140 on the top or 80 on the bottom x3 days  in a row. Please call our office at [phone] or send a MyChart message   Important Information About Sugar         Signed, Emmaline Life, NP  07/02/2022 10:54 AM    [place]

## 2022-07-02 ENCOUNTER — Encounter: Payer: Self-pay | Admitting: Nurse Practitioner

## 2022-07-02 ENCOUNTER — Ambulatory Visit: Payer: Medicare Other | Attending: Nurse Practitioner | Admitting: Nurse Practitioner

## 2022-07-02 VITALS — BP 158/84 | HR 79 | Ht 64.0 in | Wt 127.2 lb

## 2022-07-02 DIAGNOSIS — I480 Paroxysmal atrial fibrillation: Secondary | ICD-10-CM

## 2022-07-02 DIAGNOSIS — I700 Atherosclerosis of aorta: Secondary | ICD-10-CM | POA: Diagnosis not present

## 2022-07-02 DIAGNOSIS — E785 Hyperlipidemia, unspecified: Secondary | ICD-10-CM

## 2022-07-02 DIAGNOSIS — I10 Essential (primary) hypertension: Secondary | ICD-10-CM | POA: Diagnosis not present

## 2022-07-02 NOTE — Patient Instructions (Signed)
Medication Instructions:   Your physician recommends that you continue on your current medications as directed. Please refer to the Current Medication list given to you today.   *If you need a refill on your cardiac medications before your next appointment, please call your pharmacy*   Lab Work:  None ordered.  If you have labs (blood work) drawn today and your tests are completely normal, you will receive your results only by: [place] (if you have MyChart) OR A paper copy in the mail If you have any lab test that is abnormal or we need to change your treatment, we will call you to review the results.   Testing/Procedures:  None ordered.   Follow-Up: At [facility], you and your health needs are our priority.  As part of our continuing mission to provide you with exceptional heart care, we have created designated Provider Care Teams.  These Care Teams include your primary Cardiologist (physician) and Advanced Practice Providers (APPs -  Physician Assistants and Nurse Practitioners) who all work together to provide you with the care you need, when you need it.  We recommend signing up for the patient portal called "MyChart".  Sign up information is provided on this After Visit Summary.  MyChart is used to connect with patients for Virtual Visits (Telemedicine).  Patients are able to view lab/test results, encounter notes, upcoming appointments, etc.  Non-urgent messages can be sent to your provider as well.   To learn more about what you can do with MyChart, go to [email or web address].    Your next appointment:   1 year(s)  The format for your next appointment:   In Person  Provider:   Larae Grooms, MD     Other Instructions  Your physician wants you to follow-up in: 1 year with Dr. Glennon Hamilton will receive a reminder letter in the mail two months in advance. If you don't receive a letter, please call our office to schedule the follow-up  appointment.  HOW TO TAKE YOUR BLOOD PRESSURE: Rest 5 minutes before taking your blood pressure.  Don't smoke or drink caffeinated beverages for at least 30 minutes before. Take your blood pressure before (not after) you eat. Sit comfortably with your back supported and both feet on the floor (don't cross your legs). Elevate your arm to heart level on a table or a desk. Use the proper sized cuff. It should fit smoothly and snugly around your bare upper arm. There should be enough room to slip a fingertip under the cuff. The bottom edge of the cuff should be 1 inch above the crease of the elbow. Please monitor your blood pressure and if your blood pressure consistently remains above 140 on the top or 80 on the bottom x3 days in a row. Please call our office at [phone] or send a MyChart message   Important Information About Sugar

## 2022-08-13 ENCOUNTER — Other Ambulatory Visit: Payer: Self-pay

## 2022-08-13 MED ORDER — ESOMEPRAZOLE MAGNESIUM 40 MG PO CPDR: 40.0000 mg | DELAYED_RELEASE_CAPSULE | Freq: Every day | ORAL | 1 refills | Status: DC

## 2022-08-13 NOTE — Progress Notes (Signed)
Faxed request for refill on Nexium from [facility]

## 2022-08-31 ENCOUNTER — Other Ambulatory Visit: Payer: Self-pay

## 2022-09-03 MED ORDER — APIXABAN 2.5 MG PO TABS: 2.5000 mg | ORAL_TABLET | Freq: Two times a day (BID) | ORAL | 3 refills | Status: DC

## 2022-09-03 NOTE — Telephone Encounter (Signed)
Prescription refill request for Eliquis received. Indication:afib Last office visit:11/23 Scr:0.7 Age: 89 Weight:57.7  kg  Prescription refilled

## 2022-09-13 ENCOUNTER — Other Ambulatory Visit: Payer: Self-pay

## 2022-09-13 MED ORDER — AMLODIPINE BESYLATE 5 MG PO TABS: 5.0000 mg | ORAL_TABLET | Freq: Every day | ORAL | 3 refills | Status: DC

## 2022-09-13 MED ORDER — ESOMEPRAZOLE MAGNESIUM 40 MG PO CPDR: 40.0000 mg | DELAYED_RELEASE_CAPSULE | Freq: Every day | ORAL | 2 refills | Status: DC

## 2022-12-21 DIAGNOSIS — H40013 Open angle with borderline findings, low risk, bilateral: Secondary | ICD-10-CM | POA: Diagnosis not present

## 2022-12-31 DIAGNOSIS — I700 Atherosclerosis of aorta: Secondary | ICD-10-CM | POA: Diagnosis not present

## 2022-12-31 DIAGNOSIS — K219 Gastro-esophageal reflux disease without esophagitis: Secondary | ICD-10-CM | POA: Diagnosis not present

## 2022-12-31 DIAGNOSIS — I10 Essential (primary) hypertension: Secondary | ICD-10-CM | POA: Diagnosis not present

## 2022-12-31 DIAGNOSIS — Z0000 Encounter for general adult medical examination without abnormal findings: Secondary | ICD-10-CM | POA: Diagnosis not present

## 2022-12-31 DIAGNOSIS — Z9181 History of falling: Secondary | ICD-10-CM | POA: Diagnosis not present

## 2022-12-31 DIAGNOSIS — I4891 Unspecified atrial fibrillation: Secondary | ICD-10-CM | POA: Diagnosis not present

## 2022-12-31 DIAGNOSIS — M810 Age-related osteoporosis without current pathological fracture: Secondary | ICD-10-CM | POA: Diagnosis not present

## 2022-12-31 DIAGNOSIS — E782 Mixed hyperlipidemia: Secondary | ICD-10-CM | POA: Diagnosis not present

## 2023-03-07 ENCOUNTER — Other Ambulatory Visit: Payer: Self-pay

## 2023-03-07 MED ORDER — ESOMEPRAZOLE MAGNESIUM 40 MG PO CPDR: 40.0000 mg | DELAYED_RELEASE_CAPSULE | Freq: Every day | ORAL | 2 refills | Status: DC

## 2023-03-19 DIAGNOSIS — C44519 Basal cell carcinoma of skin of other part of trunk: Secondary | ICD-10-CM | POA: Diagnosis not present

## 2023-03-19 DIAGNOSIS — D485 Neoplasm of uncertain behavior of skin: Secondary | ICD-10-CM | POA: Diagnosis not present

## 2023-03-19 DIAGNOSIS — D692 Other nonthrombocytopenic purpura: Secondary | ICD-10-CM | POA: Diagnosis not present

## 2023-03-19 DIAGNOSIS — D1801 Hemangioma of skin and subcutaneous tissue: Secondary | ICD-10-CM | POA: Diagnosis not present

## 2023-03-19 DIAGNOSIS — L821 Other seborrheic keratosis: Secondary | ICD-10-CM | POA: Diagnosis not present

## 2023-03-19 DIAGNOSIS — D225 Melanocytic nevi of trunk: Secondary | ICD-10-CM | POA: Diagnosis not present

## 2023-03-19 DIAGNOSIS — C44719 Basal cell carcinoma of skin of left lower limb, including hip: Secondary | ICD-10-CM | POA: Diagnosis not present

## 2023-03-19 DIAGNOSIS — Z85820 Personal history of malignant melanoma of skin: Secondary | ICD-10-CM | POA: Diagnosis not present

## 2023-03-19 DIAGNOSIS — Z85828 Personal history of other malignant neoplasm of skin: Secondary | ICD-10-CM | POA: Diagnosis not present

## 2023-03-19 DIAGNOSIS — L814 Other melanin hyperpigmentation: Secondary | ICD-10-CM | POA: Diagnosis not present

## 2023-04-30 DIAGNOSIS — C44519 Basal cell carcinoma of skin of other part of trunk: Secondary | ICD-10-CM | POA: Diagnosis not present

## 2023-04-30 DIAGNOSIS — C44719 Basal cell carcinoma of skin of left lower limb, including hip: Secondary | ICD-10-CM | POA: Diagnosis not present

## 2023-05-06 ENCOUNTER — Other Ambulatory Visit (HOSPITAL_COMMUNITY): Payer: Self-pay | Admitting: Pain Medicine

## 2023-05-06 ENCOUNTER — Encounter (HOSPITAL_COMMUNITY): Payer: Self-pay | Admitting: Pain Medicine

## 2023-05-06 DIAGNOSIS — M549 Dorsalgia, unspecified: Secondary | ICD-10-CM

## 2023-05-06 DIAGNOSIS — R109 Unspecified abdominal pain: Secondary | ICD-10-CM | POA: Diagnosis not present

## 2023-05-06 DIAGNOSIS — Z6821 Body mass index (BMI) 21.0-21.9, adult: Secondary | ICD-10-CM | POA: Diagnosis not present

## 2023-05-07 ENCOUNTER — Ambulatory Visit (HOSPITAL_COMMUNITY)
Admission: RE | Admit: 2023-05-07 | Discharge: 2023-05-07 | Disposition: A | Discharge status: Home / Self Care | Payer: Medicare Other | Source: Ambulatory Visit | Attending: Pain Medicine | Admitting: Pain Medicine

## 2023-05-07 DIAGNOSIS — R109 Unspecified abdominal pain: Secondary | ICD-10-CM | POA: Insufficient documentation

## 2023-05-07 DIAGNOSIS — M549 Dorsalgia, unspecified: Secondary | ICD-10-CM | POA: Diagnosis not present

## 2023-05-07 MED ORDER — IOHEXOL 350 MG/ML SOLN
75.0000 mL | Freq: Once | INTRAVENOUS | Status: AC | PRN
  Administered 2023-05-07: 75 mL via INTRAVENOUS

## 2023-05-10 ENCOUNTER — Ambulatory Visit
Admission: RE | Admit: 2023-05-10 | Discharge: 2023-05-10 | Disposition: A | Discharge status: Home / Self Care | Payer: Medicare Other | Source: Ambulatory Visit | Attending: Family Medicine | Admitting: Family Medicine

## 2023-05-10 ENCOUNTER — Other Ambulatory Visit: Payer: Self-pay | Admitting: Family Medicine

## 2023-05-10 DIAGNOSIS — M545 Low back pain, unspecified: Secondary | ICD-10-CM

## 2023-05-10 DIAGNOSIS — M4856XA Collapsed vertebra, not elsewhere classified, lumbar region, initial encounter for fracture: Secondary | ICD-10-CM | POA: Diagnosis not present

## 2023-05-10 DIAGNOSIS — R319 Hematuria, unspecified: Secondary | ICD-10-CM | POA: Diagnosis not present

## 2023-05-10 DIAGNOSIS — R109 Unspecified abdominal pain: Secondary | ICD-10-CM | POA: Diagnosis not present

## 2023-05-10 DIAGNOSIS — M549 Dorsalgia, unspecified: Secondary | ICD-10-CM | POA: Diagnosis not present

## 2023-05-14 DIAGNOSIS — R109 Unspecified abdominal pain: Secondary | ICD-10-CM | POA: Diagnosis not present

## 2023-05-14 DIAGNOSIS — R319 Hematuria, unspecified: Secondary | ICD-10-CM | POA: Diagnosis not present

## 2023-05-14 DIAGNOSIS — M549 Dorsalgia, unspecified: Secondary | ICD-10-CM | POA: Diagnosis not present

## 2023-05-16 DIAGNOSIS — R188 Other ascites: Secondary | ICD-10-CM | POA: Diagnosis not present

## 2023-05-21 DIAGNOSIS — M4856XA Collapsed vertebra, not elsewhere classified, lumbar region, initial encounter for fracture: Secondary | ICD-10-CM | POA: Diagnosis not present

## 2023-05-25 ENCOUNTER — Other Ambulatory Visit: Payer: Self-pay | Admitting: Orthopedic Surgery

## 2023-05-25 DIAGNOSIS — R5381 Other malaise: Secondary | ICD-10-CM

## 2023-06-04 DIAGNOSIS — E876 Hypokalemia: Secondary | ICD-10-CM | POA: Diagnosis not present

## 2023-06-14 ENCOUNTER — Other Ambulatory Visit: Payer: Self-pay | Admitting: Orthopedic Surgery

## 2023-06-14 DIAGNOSIS — M4856XA Collapsed vertebra, not elsewhere classified, lumbar region, initial encounter for fracture: Secondary | ICD-10-CM

## 2023-06-18 DIAGNOSIS — M5451 Vertebrogenic low back pain: Secondary | ICD-10-CM | POA: Diagnosis not present

## 2023-06-18 DIAGNOSIS — M4856XA Collapsed vertebra, not elsewhere classified, lumbar region, initial encounter for fracture: Secondary | ICD-10-CM | POA: Diagnosis not present

## 2023-06-28 DIAGNOSIS — H35371 Puckering of macula, right eye: Secondary | ICD-10-CM | POA: Diagnosis not present

## 2023-06-28 DIAGNOSIS — H04123 Dry eye syndrome of bilateral lacrimal glands: Secondary | ICD-10-CM | POA: Diagnosis not present

## 2023-06-28 DIAGNOSIS — D3132 Benign neoplasm of left choroid: Secondary | ICD-10-CM | POA: Diagnosis not present

## 2023-06-28 DIAGNOSIS — H524 Presbyopia: Secondary | ICD-10-CM | POA: Diagnosis not present

## 2023-06-28 DIAGNOSIS — H40013 Open angle with borderline findings, low risk, bilateral: Secondary | ICD-10-CM | POA: Diagnosis not present

## 2023-07-09 DIAGNOSIS — M810 Age-related osteoporosis without current pathological fracture: Secondary | ICD-10-CM | POA: Diagnosis not present

## 2023-07-09 DIAGNOSIS — Z1231 Encounter for screening mammogram for malignant neoplasm of breast: Secondary | ICD-10-CM | POA: Diagnosis not present

## 2023-07-09 DIAGNOSIS — M8588 Other specified disorders of bone density and structure, other site: Secondary | ICD-10-CM | POA: Diagnosis not present

## 2023-07-19 ENCOUNTER — Other Ambulatory Visit: Payer: Self-pay | Admitting: Interventional Cardiology

## 2023-07-30 DIAGNOSIS — M4856XA Collapsed vertebra, not elsewhere classified, lumbar region, initial encounter for fracture: Secondary | ICD-10-CM | POA: Diagnosis not present

## 2023-08-29 ENCOUNTER — Other Ambulatory Visit: Payer: Self-pay | Admitting: Interventional Cardiology

## 2023-08-29 NOTE — Telephone Encounter (Signed)
 Prescription refill request for Eliquis  received. Indication: PAF Last office visit: 07/02/22  CHRISTELLA Bane NP (appt 09/16/23) Scr: 0.59 on 08/16/21  Epic Age: 89 Weight: 57.7kg  Based on above findings Eliquis  2.5mg  twice daily is the appropriate dose.  Pt is past due for labs.  Requested CBC/BMP be done at upcoming appt on 09/16/23.  Refill approved x 1.

## 2023-09-03 DIAGNOSIS — M810 Age-related osteoporosis without current pathological fracture: Secondary | ICD-10-CM | POA: Diagnosis not present

## 2023-09-03 DIAGNOSIS — I4891 Unspecified atrial fibrillation: Secondary | ICD-10-CM | POA: Diagnosis not present

## 2023-09-03 DIAGNOSIS — E782 Mixed hyperlipidemia: Secondary | ICD-10-CM | POA: Diagnosis not present

## 2023-09-03 DIAGNOSIS — I10 Essential (primary) hypertension: Secondary | ICD-10-CM | POA: Diagnosis not present

## 2023-09-03 DIAGNOSIS — I700 Atherosclerosis of aorta: Secondary | ICD-10-CM | POA: Diagnosis not present

## 2023-09-15 NOTE — Progress Notes (Unsigned)
Cardiology Office Note:  .   Date:  09/16/2023  ID:  Kayla Bass, DOB March 22, 1932, MRN [id number] PCP: Kayla Hazel, MD  [facility] Providers Cardiologist:  Kayla Muss, MD Cardiology APP:  Kayla Aland, NP    Patient Profile: .      PMH Paroxysmal atrial fibrillation on chronic anticoagulation Aortic atherosclerosis Hypertension Hyperlipidemia  Initially seen by Dr. Eldridge Bass 10/2017 at which time she reported a history of skipped beats as far back as 30 years previous, was told it was a benign finding.  At a routine appointment with PCP an irregular heartbeat was heard and she was diagnosed with PACs.  Seen by Dr. Eldridge Bass on 10/24/2021 following admission December 2022 with gastritis, dehydration, and COVID.  Was found to be in atrial fibrillation and was started on Eliquis and carvedilol. Echo showed normal LVEF 65 to 70%, no rwma, unable to evaluate diastolic function, normal RV, normal pulmonary artery systolic pressure, mild Kayla.  She was asymptomatic with A-fib.  Had 10 lb weight loss during her sickness.  She reported a rash on her legs thought to be secondary to carvedilol and she stopped it.  She was having leg swelling a few hours after waking that resolves overnight.  She was advised to decrease amlodipine to 5 mg daily for leg swelling.  Direction was given by Dr. Eldridge Bass that if swelling did not improve or if BP increased, to notify us.  She was advised to return in 7 months for follow-up.  Last cardiology clinic visit was 07/02/2022 with me.  She was overall feeling well and reported she had more energy recently.  She was walking 20 minutes 3-4 times per week with no concerning cardiac symptoms.  She continued to have mild leg edema on amlodipine 5 mg daily that resolves overnight.  She had some stomach discomfort and was working on dietary modifications.  The cost of Eliquis was high for her at that time but she did not qualify for patient  assistance and was not interested in switching to Coumadin.  EKG revealed sinus rhythm with PACs at 79 bpm, nonspecific TW abnormality.  She remained off AV nodal blocking agent with no concerns for tachypalpitations.  History of whitecoat hypertension with elevated BP in the office.  She reported home SBP typically 135 mmHg.  LDL cholesterol was 86 on 4/0/9811.  We discussed goal of LDL < 70 and she was encouraged to consider stopping lovastatin and starting atorvastatin or rosuvastatin.  She wanted to discuss with her PCP.  1 year follow-up was recommended.       History of Present Illness: .   Kayla Bass is a very pleasant 89 y.o. female who is here today for follow-up and is accompanied by her son. She reports a recent back injury due to a fall, resulting in a fracture of the L5 vertebra. This injury limited their physical activity for about three months, but she has since resumed walking when possible, up to 30 minutes per day. She denies any concerning cardiac symptoms such as chest pain, shortness of breath, or palpitations, except when extremely upset. She occasionally monitors home BP which is always well-controlled but tends to be higher during doctor's visits. She has been on lovastatin for a long time.  I asked about recent lab work with PCP which she just had done, but she is not aware of specific findings.  We will [facility] to get [facility] from [facility]. She asks about continuing Eliquis despite no further  episodes of a fib to her awareness since hospitalization in 2022, is concerned about the cost. History of  H. pylori infection and ongoing issues with acid reflux. She has tried Nexium in the past, but it did not provide significant relief. No specific cardiac symptoms to report.   Discussed the use of Kayla Bass software for clinical note transcription with the patient, who gave verbal consent to proceed.   ROS: See HPI       Studies Reviewed: Marland Kitchen   EKG Interpretation Date/Time:  Monday  September 16 2023 11:29:41 EST Ventricular Rate:  73 PR Interval:  176 QRS Duration:  70 QT Interval:  376 QTC Calculation: 414 R Axis:   22  Text Interpretation: Normal sinus rhythm Low voltage QRS Septal infarct , age undetermined No acute changes Confirmed by Kayla Bass [phone]) on 09/16/2023 11:46:06 AM    Risk Assessment/Calculations:    CHA2DS2-VASc Score = 4   This indicates a 4.8% annual risk of stroke. The patient's score is based upon: CHF History: 0 HTN History: 1 Diabetes History: 0 Stroke History: 0 Vascular Disease History: 0 Age Score: 2 Gender Score: 1            Physical Exam:   VS:  BP 130/72   Pulse 73   Ht 5\' 4"  (1.626 m)   Wt 130 lb 6.4 oz (59.1 kg)   SpO2 96%   BMI 22.38 kg/m    Wt Readings from Last 3 Encounters:  09/16/23 130 lb 6.4 oz (59.1 kg)  07/02/22 127 lb 3.2 oz (57.7 kg)  06/12/22 125 lb 2 oz (56.8 kg)    GEN: Well nourished, well developed in no acute distress NECK: No JVD; No carotid bruits CARDIAC: RRR, no murmurs, rubs, gallops RESPIRATORY:  Clear to auscultation without rales, wheezing or rhonchi  ABDOMEN: Soft, non-tender, non-distended EXTREMITIES:  No edema; No deformity     ASSESSMENT AND PLAN: .    PAF on chronic anticoagulation: A fib initially identified during hospitalization for gastritis, COVID and dehydration. No further episodes to her awareness. She asks about discontinuing OAC due to infrequency of A-fib. Lengthy discussion about ways to monitor including 30 day monitor versus LINQ to evaluate for presence of a fib. She does not want to pursue wearing a monitor and states she would still be concerned about possible stroke. No bleeding concerns. We will continue Eliquis 2.5 mg twice daily which is appropriate dose (age, weight) for stroke prevention for CHA2DS2-VASc score of 3. Advised her to notify us with bleeding concerns in the future.   Gastric discomfort: She reports that she continues to have frequent  belching and gastric discomfort.  She did not feel a significant improvement when taking Nexium and so she discontinued it.  Encouraged her to follow-up with GI for management of symptoms.   Hypertension: BP initially elevated but improved on my recheck.  Encouraged occasional home monitoring just to ensure stability.  No medication changes today.  Hyperlipidemia LDL goal < 70/Aortic atherosclerosis: Aortic atherosclerosis noted on prior CT. She denies chest pain, dyspnea, or other symptoms concerning for angina. No indication for further ischemic evaluation at this time. Lipid panel completed 12/31/2022 with total cholesterol 189, HDL 63, LDL 97, and triglycerides 981. She thinks she has had more recent lab work with PCP. We will try to get labs from PCP.  We discussed the importance of LDL 70 or lower in the setting of aortic atherosclerosis.  She is agreeable to consider changing lipid-lowering agent  if LDL remains above goal. Would favor discontinuation of lovastatin and starting rosuvastatin 20 mg daily. Focus on secondary prevention including heart healthy mostly plant based diet avoiding saturated fat, processed foods, simple carbohydrates, and sugar along with aiming for at least 150 minutes of moderate intensity exercise each week.        Dispo: 1 year with  me  Signed, Kayla Bridegroom, NP-C

## 2023-09-16 ENCOUNTER — Ambulatory Visit: Payer: Medicare Other | Attending: Nurse Practitioner | Admitting: Nurse Practitioner

## 2023-09-16 ENCOUNTER — Encounter: Payer: Self-pay | Admitting: Nurse Practitioner

## 2023-09-16 VITALS — BP 130/72 | HR 73 | Ht 64.0 in | Wt 130.4 lb

## 2023-09-16 DIAGNOSIS — K219 Gastro-esophageal reflux disease without esophagitis: Secondary | ICD-10-CM | POA: Diagnosis not present

## 2023-09-16 DIAGNOSIS — I700 Atherosclerosis of aorta: Secondary | ICD-10-CM | POA: Diagnosis not present

## 2023-09-16 DIAGNOSIS — I10 Essential (primary) hypertension: Secondary | ICD-10-CM

## 2023-09-16 DIAGNOSIS — E785 Hyperlipidemia, unspecified: Secondary | ICD-10-CM | POA: Diagnosis not present

## 2023-09-16 DIAGNOSIS — I480 Paroxysmal atrial fibrillation: Secondary | ICD-10-CM

## 2023-09-16 DIAGNOSIS — D6869 Other thrombophilia: Secondary | ICD-10-CM | POA: Diagnosis not present

## 2023-09-16 NOTE — Patient Instructions (Signed)
Medication Instructions:   Your physician recommends that you continue on your current medications as directed. Please refer to the Current Medication list given to you today.   *If you need a refill on your cardiac medications before your next appointment, please call your pharmacy*   Lab Work:  Getting recent labs from [place].   If you have labs (blood work) drawn today and your tests are completely normal, you will receive your results only by: MyChart Message (if you have MyChart) OR A paper copy in the mail If you have any lab test that is abnormal or we need to change your treatment, we will call you to review the results.   Testing/Procedures:  None ordered.   Follow-Up: At [facility], you and your health needs are our priority.  As part of our continuing mission to provide you with exceptional heart care, we have created designated Provider Care Teams.  These Care Teams include your primary Cardiologist (physician) and Advanced Practice Providers (APPs -  Physician Assistants and Nurse Practitioners) who all work together to provide you with the care you need, when you need it.  We recommend signing up for the patient portal called "MyChart".  Sign up information is provided on this After Visit Summary.  MyChart is used to connect with patients for Virtual Visits (Telemedicine).  Patients are able to view lab/test results, encounter notes, upcoming appointments, etc.  Non-urgent messages can be sent to your provider as well.   To learn more about what you can do with MyChart, go to [email or web address].    Your next appointment:   1 year(s)  Provider:   Eligha Bridegroom, NP    Other Instructions  Your physician wants you to follow-up in: 1 year.  You will receive a reminder letter in the mail two months in advance. If you don't receive a letter, please call our office to schedule the follow-up appointment.

## 2023-09-18 ENCOUNTER — Other Ambulatory Visit (HOSPITAL_BASED_OUTPATIENT_CLINIC_OR_DEPARTMENT_OTHER): Payer: Self-pay | Admitting: *Deleted

## 2023-09-18 ENCOUNTER — Telehealth: Payer: Self-pay | Admitting: Nurse Practitioner

## 2023-09-18 DIAGNOSIS — E785 Hyperlipidemia, unspecified: Secondary | ICD-10-CM

## 2023-09-18 DIAGNOSIS — I700 Atherosclerosis of aorta: Secondary | ICD-10-CM

## 2023-09-18 MED ORDER — ROSUVASTATIN CALCIUM 10 MG PO TABS: 10.0000 mg | ORAL_TABLET | Freq: Every day | ORAL | 3 refills | Status: DC

## 2023-09-18 NOTE — Telephone Encounter (Signed)
I reviewed her most recent labs from [place]. She did not have a cholesterol test completed. I discussed with her and her son that her LDL in May 2024 was 97. She has plaque in her aorta that was picked up on a previous CT. We have not fully investigated for coronary artery blockage but she has not had any signs or symptoms of this. The recommendation with aortic atherosclerosis is to keep the LDL at 70 or lower. She has not been on a cholesterol medication to my awareness. It is her decision regarding whether she would like to take a statin to reduce LDL which will reduce the risk of future heart attack or stroke. If she chooses to start, I would recommend rosuvastatin 10 mg daily. We would recheck lipid and ALT in 2-3 months.

## 2023-09-18 NOTE — Telephone Encounter (Signed)
S/w pt is aware of Michelle's recommendations. Pt is on lovastatin and will start rosuvastatin one (1) tablet by mouth ( 10 mg) daily Sent in # 90 to requested pharmacy and updated medication list. Placed orders lipid/alt, printed requisition and placed in mail. Address updated.

## 2023-10-27 IMAGING — CT CT ABD-PELV W/ CM
3 of 6 series · 16 of 46 positions shown, 18 images · IV contrast (omnipaque)
Comparison: None.

CLINICAL DATA: Epigastric pain and left lower quadrant pain for 6
weeks. Bloating and 10 lb weight loss over last 6 weeks.

EXAM:
CT ABDOMEN AND PELVIS WITH CONTRAST
TECHNIQUE: Multidetector CT imaging of the abdomen and pelvis was performed
using the standard protocol following bolus administration of
intravenous contrast.
CONTRAST:  75mL OMNIPAQUE IOHEXOL 350 MG/ML SOLN

[Series 2: axial st · axial · 0.64mm/px · z∈[-731,-356]mm · 11 of 91 slices shown, 13 images]
[im 8/91  soft-tissue]
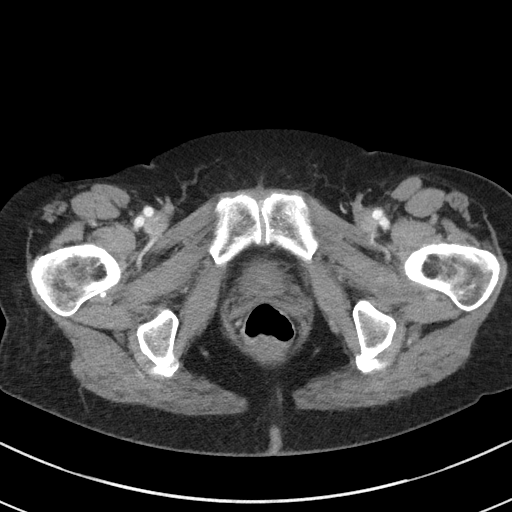
[im 8/91  bone]
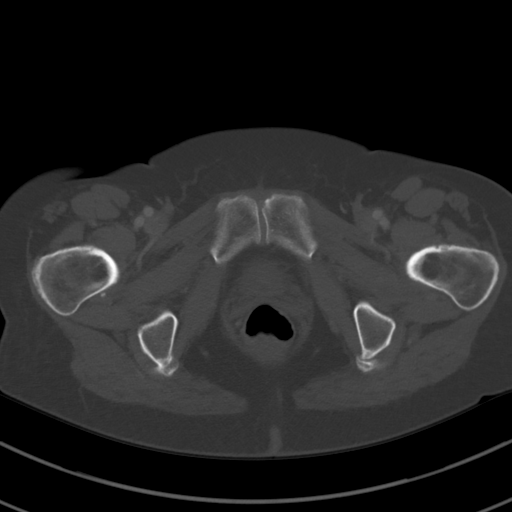
[im 16/91  soft-tissue]
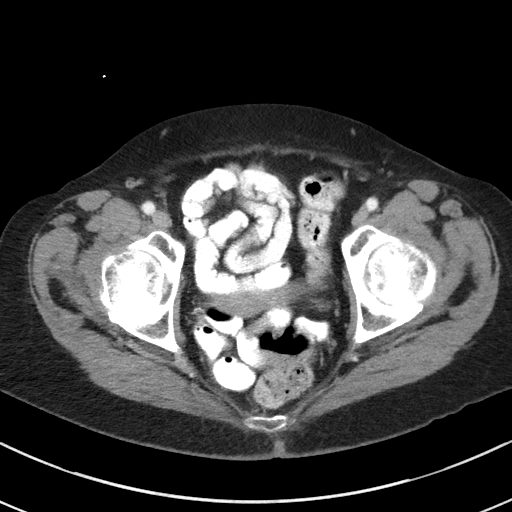
[im 23/91  soft-tissue]
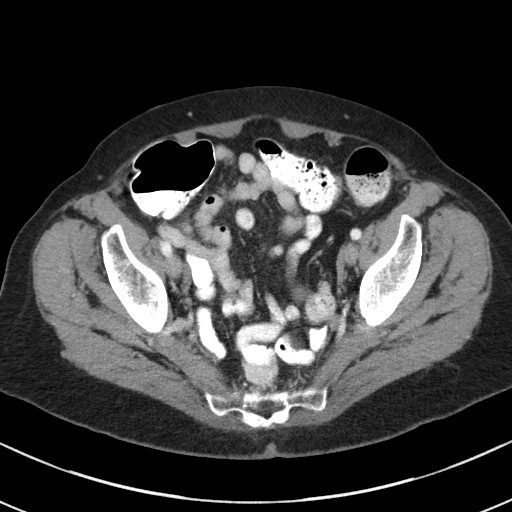
[im 31/91  soft-tissue]
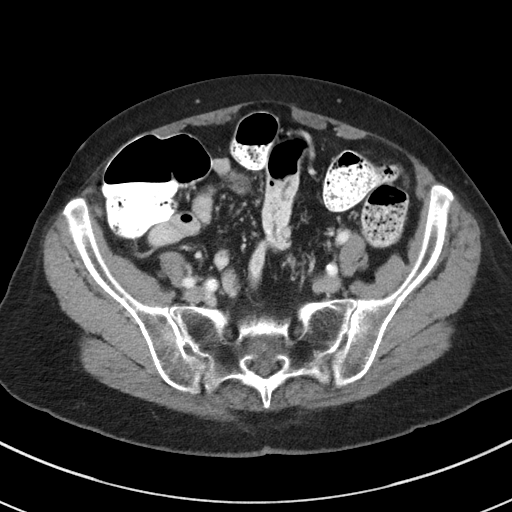
[im 38/91  soft-tissue]
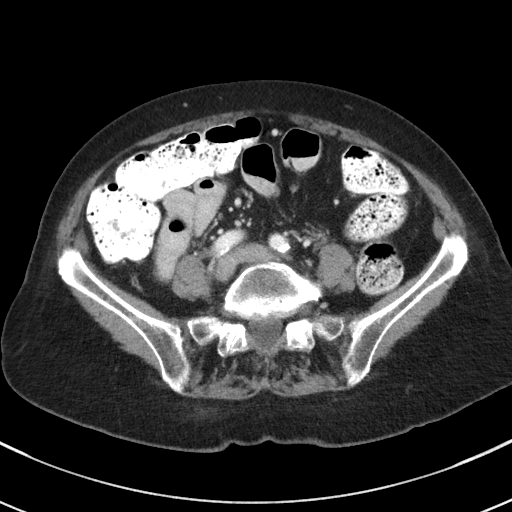
[im 46/91  soft-tissue]
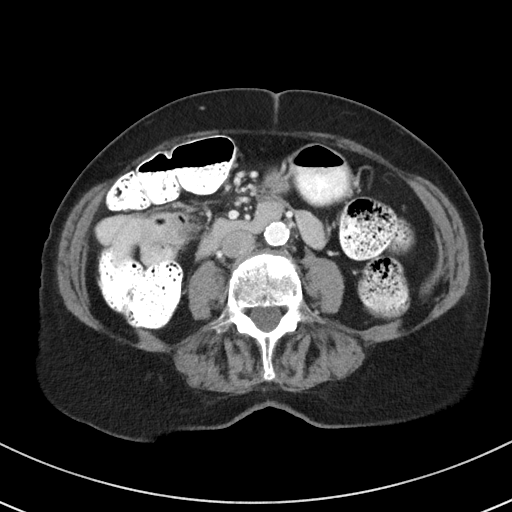
[im 53/91  soft-tissue]
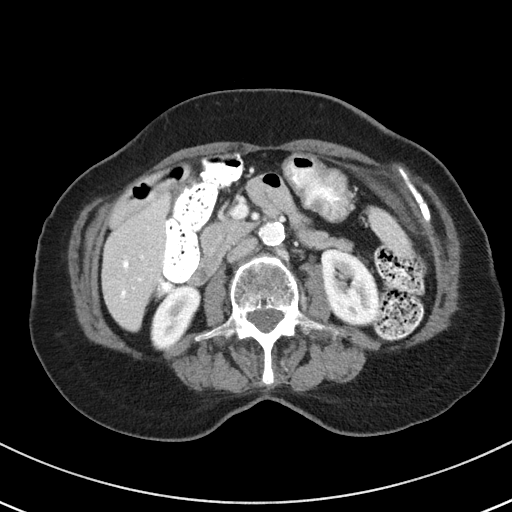
[im 61/91  soft-tissue]
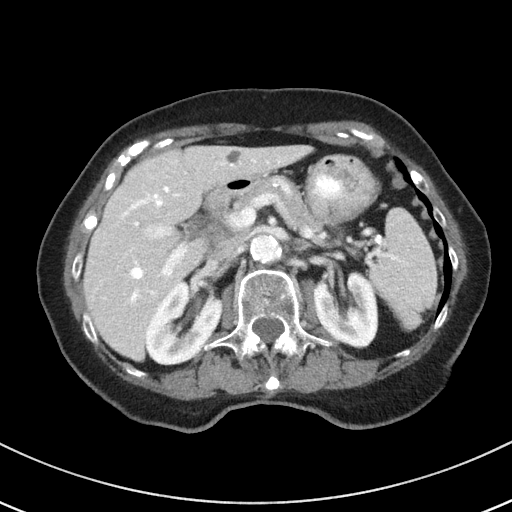
[im 68/91  soft-tissue]
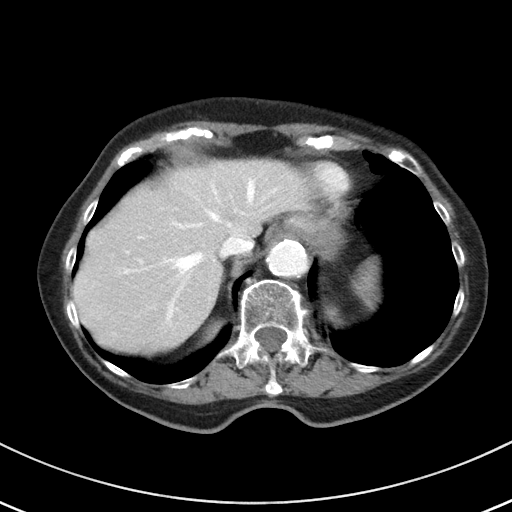
[im 68/91  bone]
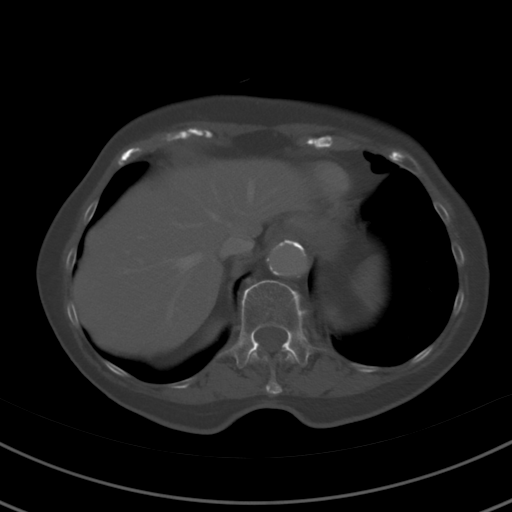
[im 76/91  soft-tissue]
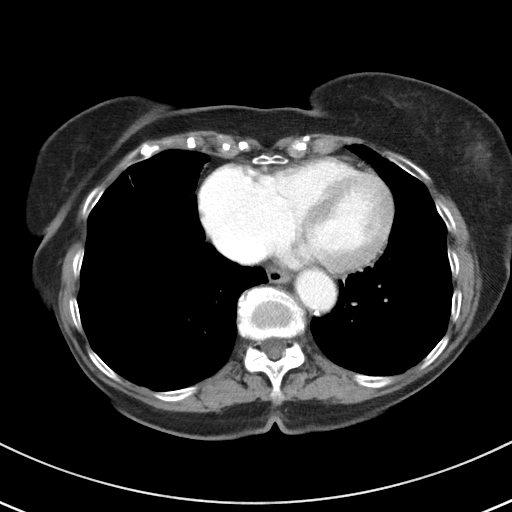
[im 83/91  soft-tissue]
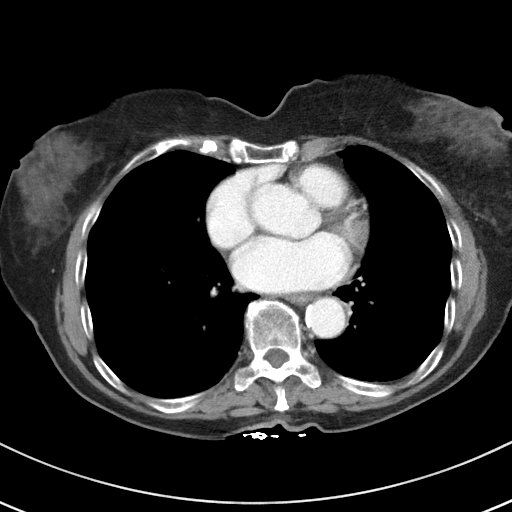

[Series 4: coronal st · coronal · 0.72mm/px · 3 of 74 slices shown]
[im 25/74  soft-tissue]
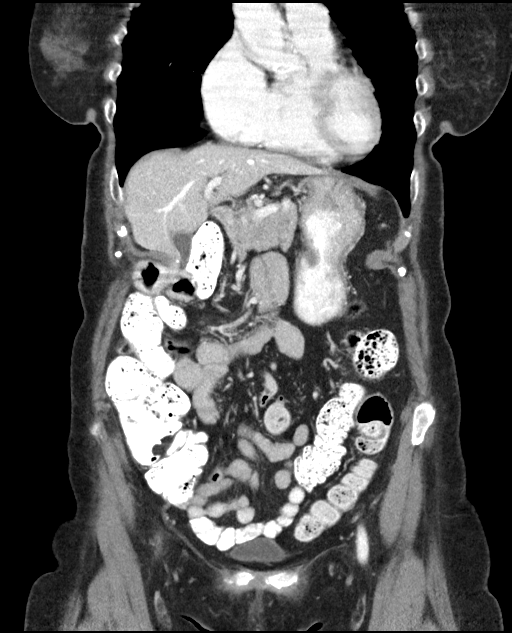
[im 33/74  soft-tissue]
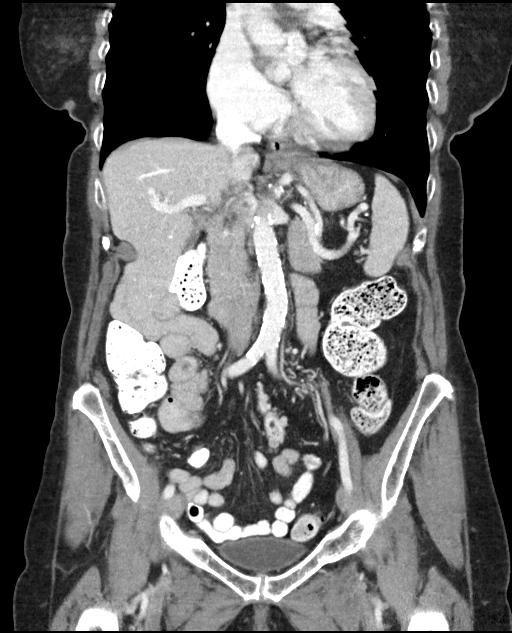
[im 41/74  soft-tissue]
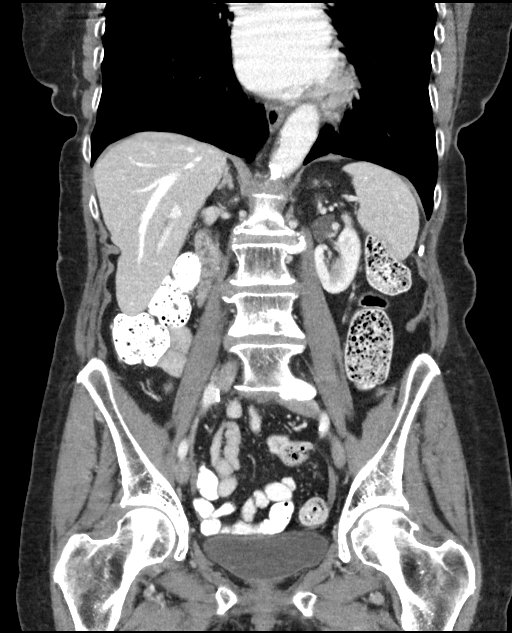

[Series 6: lung bases · axial · 0.64mm/px · z∈[-466,-438]mm · 2 of 82 slices shown]
[im 7/82  bone]
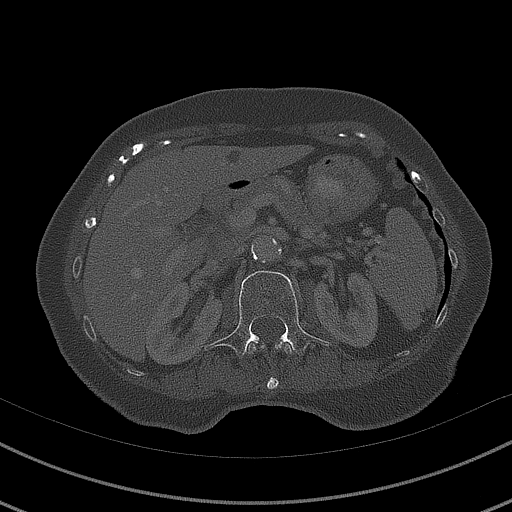
[im 21/82  bone]
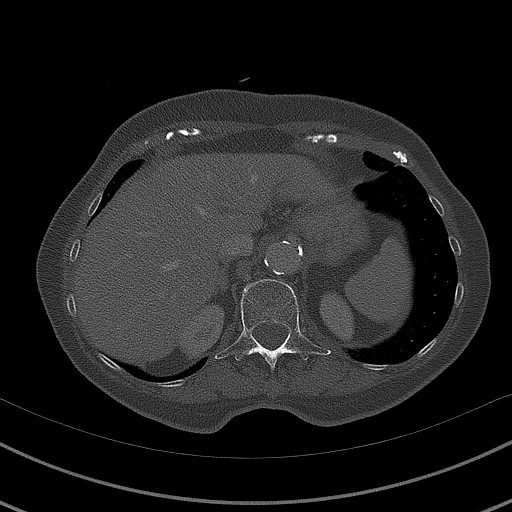

[16 of 46 positions shown; findings below may reference images not displayed]

FINDINGS: Lower Chest: No acute findings.

Hepatobiliary: No hepatic masses identified. Small cyst noted in the
left hepatic lobe. Gallbladder is unremarkable. No evidence of
biliary ductal dilatation.

Pancreas:  No mass or inflammatory changes.

Spleen: Within normal limits in size and appearance.

Adrenals/Urinary Tract: No masses identified. No evidence of
ureteral calculi or hydronephrosis.

Stomach/Bowel: No evidence of obstruction, inflammatory process or
abnormal fluid collections.

Vascular/Lymphatic: No pathologically enlarged lymph nodes. No acute
vascular findings. Aortic atherosclerotic calcification noted.

Reproductive:  No mass or other significant abnormality.

Other:  None.

Musculoskeletal:  No suspicious bone lesions identified.
IMPRESSION: No acute findings or other significant abnormality.

Aortic Atherosclerosis (T840D-G2X.X).

## 2023-10-31 ENCOUNTER — Other Ambulatory Visit: Payer: Self-pay | Admitting: Nurse Practitioner

## 2023-11-08 IMAGING — DX DG CHEST 1V PORT
1 series · 1 of 1 positions shown · non-contrast
Comparison: None.

CLINICAL DATA: Shortness of breath over the last 2 weeks.
Coronavirus infection.

EXAM:
PORTABLE CHEST 1 VIEW

[chest ap]
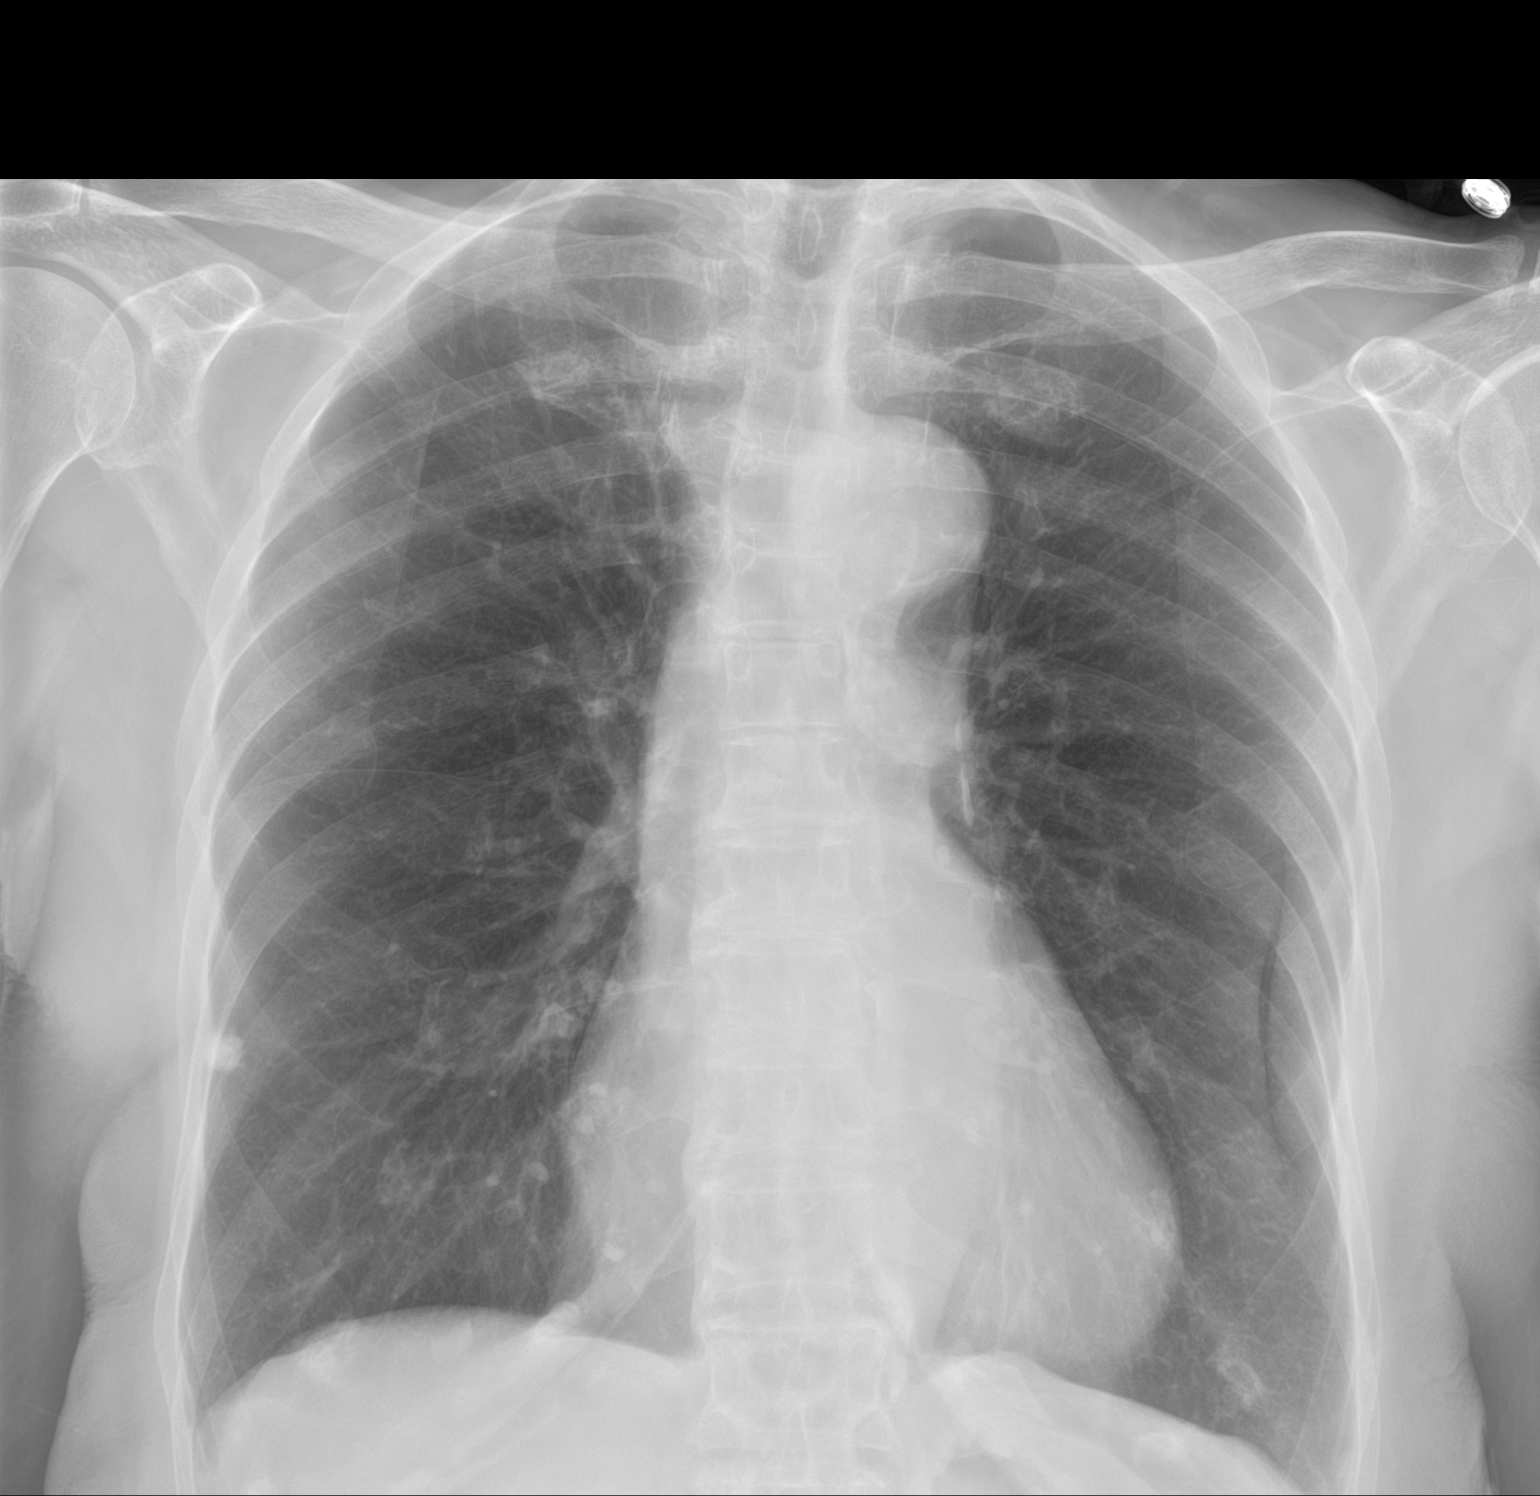

[1 of 1 positions shown; findings below may reference images not displayed]

FINDINGS: Mild cardiomegaly. Aortic atherosclerotic calcification. Numerous
calcified granulomas in the lower lungs, more numerous on the right
than the left. No sign infiltrate, collapse or effusion. No focal
bone finding.
IMPRESSION: No active cardiopulmonary disease.

Mild cardiomegaly.  Aortic atherosclerosis.

Numerous calcified granulomas in the lower lungs, more numerous on
the right than the left

## 2023-11-25 ENCOUNTER — Other Ambulatory Visit: Payer: Self-pay | Admitting: Interventional Cardiology

## 2023-11-25 DIAGNOSIS — E785 Hyperlipidemia, unspecified: Secondary | ICD-10-CM | POA: Diagnosis not present

## 2023-11-25 DIAGNOSIS — I700 Atherosclerosis of aorta: Secondary | ICD-10-CM | POA: Diagnosis not present

## 2023-11-25 LAB — ALT: ALT: 13 IU/L (ref 0–32)

## 2023-11-25 NOTE — Telephone Encounter (Addendum)
 Prescription refill request for Eliquis received. Indication: AF Last office visit: 09/16/23  Benjamine Sprague NP Scr: .59 on 08/16/21 Age: 89 Weight: 59.1kg  Based on above findings Eliquis 2.5mg  twice daily is the appropriate dose.  Pt is past due for labs.  Per Care Everywhere she had labs ordered today by [facility].  Refill approved x 1.

## 2023-11-26 LAB — LIPID PANEL
Chol/HDL Ratio: 2.7 ratio (ref 0.0–4.4)
Cholesterol, Total: 175 mg/dL (ref 100–199)
HDL: 64 mg/dL (ref >39)
LDL Chol Calc (NIH): 93 mg/dL (ref 0–99)
Triglycerides: 97 mg/dL (ref 0–149)
VLDL Cholesterol Cal: 18 mg/dL (ref 5–40)

## 2023-11-27 ENCOUNTER — Other Ambulatory Visit (HOSPITAL_BASED_OUTPATIENT_CLINIC_OR_DEPARTMENT_OTHER): Payer: Self-pay | Admitting: *Deleted

## 2023-11-27 DIAGNOSIS — E785 Hyperlipidemia, unspecified: Secondary | ICD-10-CM

## 2023-11-27 DIAGNOSIS — I700 Atherosclerosis of aorta: Secondary | ICD-10-CM

## 2023-11-27 MED ORDER — EZETIMIBE 10 MG PO TABS: 10.0000 mg | ORAL_TABLET | Freq: Every day | ORAL | 3 refills | Status: DC

## 2023-12-30 DIAGNOSIS — H40013 Open angle with borderline findings, low risk, bilateral: Secondary | ICD-10-CM | POA: Diagnosis not present

## 2024-01-02 DIAGNOSIS — I10 Essential (primary) hypertension: Secondary | ICD-10-CM | POA: Diagnosis not present

## 2024-01-02 DIAGNOSIS — E782 Mixed hyperlipidemia: Secondary | ICD-10-CM | POA: Diagnosis not present

## 2024-01-02 DIAGNOSIS — Z85820 Personal history of malignant melanoma of skin: Secondary | ICD-10-CM | POA: Diagnosis not present

## 2024-01-02 DIAGNOSIS — M810 Age-related osteoporosis without current pathological fracture: Secondary | ICD-10-CM | POA: Diagnosis not present

## 2024-01-02 DIAGNOSIS — J8410 Pulmonary fibrosis, unspecified: Secondary | ICD-10-CM | POA: Diagnosis not present

## 2024-01-02 DIAGNOSIS — Z6821 Body mass index (BMI) 21.0-21.9, adult: Secondary | ICD-10-CM | POA: Diagnosis not present

## 2024-01-02 DIAGNOSIS — D6869 Other thrombophilia: Secondary | ICD-10-CM | POA: Diagnosis not present

## 2024-01-02 DIAGNOSIS — I4891 Unspecified atrial fibrillation: Secondary | ICD-10-CM | POA: Diagnosis not present

## 2024-01-13 DIAGNOSIS — Z6822 Body mass index (BMI) 22.0-22.9, adult: Secondary | ICD-10-CM | POA: Diagnosis not present

## 2024-01-13 DIAGNOSIS — Z0000 Encounter for general adult medical examination without abnormal findings: Secondary | ICD-10-CM | POA: Diagnosis not present

## 2024-01-13 DIAGNOSIS — Z1389 Encounter for screening for other disorder: Secondary | ICD-10-CM | POA: Diagnosis not present

## 2024-01-27 DIAGNOSIS — E785 Hyperlipidemia, unspecified: Secondary | ICD-10-CM | POA: Diagnosis not present

## 2024-01-27 DIAGNOSIS — I700 Atherosclerosis of aorta: Secondary | ICD-10-CM | POA: Diagnosis not present

## 2024-01-28 ENCOUNTER — Ambulatory Visit (HOSPITAL_BASED_OUTPATIENT_CLINIC_OR_DEPARTMENT_OTHER): Payer: Self-pay | Admitting: Nurse Practitioner

## 2024-01-28 LAB — LIPID PANEL
Chol/HDL Ratio: 2.5 ratio (ref 0.0–4.4)
Cholesterol, Total: 151 mg/dL (ref 100–199)
HDL: 60 mg/dL (ref >39)
LDL Chol Calc (NIH): 72 mg/dL (ref 0–99)
Triglycerides: 105 mg/dL (ref 0–149)
VLDL Cholesterol Cal: 19 mg/dL (ref 5–40)

## 2024-01-28 LAB — ALT: ALT: 12 IU/L (ref 0–32)

## 2024-02-19 ENCOUNTER — Other Ambulatory Visit: Payer: Self-pay | Admitting: Interventional Cardiology

## 2024-02-19 NOTE — Telephone Encounter (Signed)
 Prescription refill request for Eliquis  received. Indication:afib Last office visit:1/25 Scr:0.76  9/24 Age: 89 Weight:59.1  kg  Prescription refilled

## 2024-04-28 DIAGNOSIS — D225 Melanocytic nevi of trunk: Secondary | ICD-10-CM | POA: Diagnosis not present

## 2024-04-28 DIAGNOSIS — L821 Other seborrheic keratosis: Secondary | ICD-10-CM | POA: Diagnosis not present

## 2024-04-28 DIAGNOSIS — Z85828 Personal history of other malignant neoplasm of skin: Secondary | ICD-10-CM | POA: Diagnosis not present

## 2024-04-28 DIAGNOSIS — L820 Inflamed seborrheic keratosis: Secondary | ICD-10-CM | POA: Diagnosis not present

## 2024-04-28 DIAGNOSIS — L720 Epidermal cyst: Secondary | ICD-10-CM | POA: Diagnosis not present

## 2024-04-28 DIAGNOSIS — L814 Other melanin hyperpigmentation: Secondary | ICD-10-CM | POA: Diagnosis not present

## 2024-04-28 DIAGNOSIS — Z85820 Personal history of malignant melanoma of skin: Secondary | ICD-10-CM | POA: Diagnosis not present

## 2024-04-28 DIAGNOSIS — C44311 Basal cell carcinoma of skin of nose: Secondary | ICD-10-CM | POA: Diagnosis not present

## 2024-04-28 DIAGNOSIS — D1801 Hemangioma of skin and subcutaneous tissue: Secondary | ICD-10-CM | POA: Diagnosis not present

## 2024-05-10 ENCOUNTER — Other Ambulatory Visit (HOSPITAL_BASED_OUTPATIENT_CLINIC_OR_DEPARTMENT_OTHER): Payer: Self-pay | Admitting: Nurse Practitioner

## 2024-06-04 DIAGNOSIS — C44311 Basal cell carcinoma of skin of nose: Secondary | ICD-10-CM | POA: Diagnosis not present

## 2024-07-09 ENCOUNTER — Other Ambulatory Visit: Payer: Self-pay

## 2024-07-09 MED ORDER — APIXABAN 2.5 MG PO TABS: 2.5000 mg | ORAL_TABLET | Freq: Two times a day (BID) | ORAL | 1 refills | Status: DC

## 2024-07-12 ENCOUNTER — Other Ambulatory Visit (HOSPITAL_BASED_OUTPATIENT_CLINIC_OR_DEPARTMENT_OTHER): Payer: Self-pay | Admitting: Nurse Practitioner

## 2024-07-28 ENCOUNTER — Telehealth: Payer: Self-pay | Admitting: Nurse Practitioner

## 2024-07-28 NOTE — Telephone Encounter (Signed)
*  STAT* If patient is at the pharmacy, call can be transferred to refill team.   1. Which medications need to be refilled? (please list name of each medication and dose if known) apixaban  (ELIQUIS ) 2.5 MG TABS tablet    2. Would you like to learn more about the convenience, safety, & potential cost savings by using the [facility]? No   3. Are you open to using the [facility] (Type [facility].) No   4. Which pharmacy/location (including street and city if local pharmacy) is medication to be sent to?  [facility] [address] [place] - [address]   5. Do they need a 30 day or 90 day supply? 90 day

## 2024-07-31 MED ORDER — APIXABAN 2.5 MG PO TABS: 2.5000 mg | ORAL_TABLET | Freq: Two times a day (BID) | ORAL | 1 refills | Status: AC

## 2024-08-17 ENCOUNTER — Other Ambulatory Visit (HOSPITAL_BASED_OUTPATIENT_CLINIC_OR_DEPARTMENT_OTHER): Payer: Self-pay | Admitting: Nurse Practitioner

## 2024-09-17 ENCOUNTER — Encounter (HOSPITAL_BASED_OUTPATIENT_CLINIC_OR_DEPARTMENT_OTHER): Payer: Self-pay

## 2024-09-18 ENCOUNTER — Other Ambulatory Visit (HOSPITAL_BASED_OUTPATIENT_CLINIC_OR_DEPARTMENT_OTHER): Payer: Self-pay

## 2024-09-18 ENCOUNTER — Ambulatory Visit (HOSPITAL_BASED_OUTPATIENT_CLINIC_OR_DEPARTMENT_OTHER): Admitting: Cardiovascular Disease

## 2024-09-18 ENCOUNTER — Encounter (HOSPITAL_BASED_OUTPATIENT_CLINIC_OR_DEPARTMENT_OTHER): Payer: Self-pay | Admitting: Cardiovascular Disease

## 2024-09-18 VITALS — BP 136/74 | HR 81 | Ht 64.0 in | Wt 128.0 lb

## 2024-09-18 DIAGNOSIS — I10 Essential (primary) hypertension: Secondary | ICD-10-CM

## 2024-09-18 DIAGNOSIS — I491 Atrial premature depolarization: Secondary | ICD-10-CM

## 2024-09-18 MED ORDER — OLMESARTAN-AMLODIPINE-HCTZ 20-5-12.5 MG PO TABS: 1.0000 | ORAL_TABLET | Freq: Every day | ORAL | 3 refills | Status: AC

## 2024-09-18 MED ORDER — ROSUVASTATIN CALCIUM 10 MG PO TABS: 10.0000 mg | ORAL_TABLET | Freq: Every day | ORAL | 3 refills | Status: DC

## 2024-09-18 NOTE — Patient Instructions (Addendum)
 Medication Instructions:  STOP LISINOPRIL    STOP AMLODIPINE   STOP hydrochlorothiazide  STAR OLMESARTAN-AMLODIPINE -HCT 20-5-12.5 MG DAILY    *If you need a refill on your cardiac medications before your next appointment, please call your pharmacy*  Lab Work: NONE  Testing/Procedures: NONE  Follow-Up: At [facility], you and your health needs are our priority.  As part of our continuing mission to provide you with exceptional heart care, our providers are all part of one team.  This team includes your primary Cardiologist (physician) and Advanced Practice Providers or APPs (Physician Assistants and Nurse Practitioners) who all work together to provide you with the care you need, when you need it.  Your next appointment:   12 month(s)  Provider:   Annabella Scarce, MD, Rosaline Bane, NP, or Reche Finder, NP    We recommend signing up for the patient portal called MyChart.  Sign up information is provided on this After Visit Summary.  MyChart is used to connect with patients for Virtual Visits (Telemedicine).  Patients are able to view lab/test results, encounter notes, upcoming appointments, etc.  Non-urgent messages can be sent to your provider as well.   To learn more about what you can do with MyChart, go to [email or web address].   Other Instructions MONITOR YOUR BLOOD PRESSURE AT HOME. CALL THE OFFICE OR SEND MYCHART MESSAGE IF NOT CONSISTENTLY BELOW 130/80

## 2024-09-18 NOTE — Progress Notes (Unsigned)
" °  Cardiology Office Note:  .   Date:  09/18/2024  ID:  Kayla Bass, DOB [birth year], MRN [id number] PCP: Cleotilde Planas, MD  [facility] Providers Cardiologist:  Candyce Reek, MD Cardiology APP:  Percy Rosaline CHRISTELLA, NP { Click to update primary MD,subspecialty MD or APP then REFRESH:1}   History of Present Illness: .   Kayla Bass is a 89 y.o. female with aortic atherosclerosis, hypertension, hyperlipidemia and PAF here for follow up.  SHe was previously a patient of Dr. Reek.    Discussed the use of AI scribe software for clinical note transcription with the patient, who gave verbal consent to proceed.  History of Present Illness     ROS:  As per HPI  Studies Reviewed: .       Echo 07/2021: 1. Left ventricular ejection fraction, by estimation, is 65 to 70%. Left  ventricular ejection fraction by 3D volume is 66 %. The left ventricle has  normal function. The left ventricle has no regional wall motion  abnormalities. Left ventricular diastolic   function could not be evaluated.   2. Right ventricular systolic function is normal. The right ventricular  size is normal. There is normal pulmonary artery systolic pressure.   3. Trivial mitral valve regurgitation.   4. The aortic valve is normal in structure. Aortic valve regurgitation is  mild. No aortic stenosis is present.  *** Risk Assessment/Calculations:   {Does this patient have ATRIAL FIBRILLATION?:[id number]} No BP recorded.  {Refresh Note OR Click here to enter BP  :1}***       Physical Exam:   VS:  There were no vitals taken for this visit. , BMI There is no height or weight on file to calculate BMI. GENERAL:  Well appearing HEENT: Pupils equal round and reactive, fundi not visualized, oral mucosa unremarkable NECK:  No jugular venous distention, waveform within normal limits, carotid upstroke brisk and symmetric, no bruits, no thyromegaly LYMPHATICS:  No cervical adenopathy LUNGS:  Clear to  auscultation bilaterally HEART:  RRR.  PMI not displaced or sustained,S1 and S2 within normal limits, no S3, no S4, no clicks, no rubs, *** murmurs ABD:  Flat, positive bowel sounds normal in frequency in pitch, no bruits, no rebound, no guarding, no midline pulsatile mass, no hepatomegaly, no splenomegaly EXT:  2 plus pulses throughout, no edema, no cyanosis no clubbing SKIN:  No rashes no nodules NEURO:  Cranial nerves II through XII grossly intact, motor grossly intact throughout PSYCH:  Cognitively intact, oriented to person place and time   ASSESSMENT AND PLAN: .   *** Assessment and Plan Assessment & Plan         {Are you ordering a CV Procedure (e.g. stress test, cath, DCCV, TEE, etc)?   Press F2        :[id number]}  Dispo: ***  Signed, Annabella Scarce, MD   "

## 2024-09-21 ENCOUNTER — Ambulatory Visit (HOSPITAL_BASED_OUTPATIENT_CLINIC_OR_DEPARTMENT_OTHER): Admitting: Nurse Practitioner

## 2024-09-22 ENCOUNTER — Other Ambulatory Visit (HOSPITAL_COMMUNITY): Payer: Self-pay

## 2024-09-22 ENCOUNTER — Other Ambulatory Visit: Payer: Self-pay | Admitting: Cardiovascular Disease

## 2024-09-22 MED ORDER — EZETIMIBE 10 MG PO TABS
10.0000 mg | ORAL_TABLET | Freq: Every day | ORAL | 0 refills | Status: AC
  Filled 2024-09-22: qty 90, 90d supply, fill #0

## 2024-09-22 MED ORDER — ROSUVASTATIN CALCIUM 10 MG PO TABS
10.0000 mg | ORAL_TABLET | Freq: Every day | ORAL | 0 refills | Status: AC
  Filled 2024-09-22: qty 90, 90d supply, fill #0

## 2024-09-22 NOTE — Telephone Encounter (Signed)
" °*  STAT* If patient is at the pharmacy, call can be transferred to refill team.   1. Which medications need to be refilled? (please list name of each medication and dose if known)   rosuvastatin  (CRESTOR ) 10 MG tablet    ezetimibe  (ZETIA ) 10 MG tablet    2. Which pharmacy/location (including street and city if local pharmacy) is medication to be sent to? [facility] - [place], [place] - [address]      3. Do they need a 30 day or 90 day supply? 90 day    Pt was seen in office 09/18/24 by MD. But pt stated medications should be going to her preferred listed pharmacy and not [facility]. Please correct.  "

## 2024-09-24 ENCOUNTER — Other Ambulatory Visit: Payer: Self-pay

## 2024-09-29 NOTE — Telephone Encounter (Signed)
 Overdue CMP  In accordance with refill protocols, please review and address the following requirements before this medication refill can be authorized:  Labs

## 2024-10-02 ENCOUNTER — Other Ambulatory Visit (HOSPITAL_COMMUNITY): Payer: Self-pay
